# Patient Record
Sex: Female | Born: 1985 | Race: Asian | Hispanic: No | Marital: Married | Smoking: Never smoker
Health system: Southern US, Community
[De-identification: ages and names within clinical notes are randomized; demographics above are authoritative.]

## PROBLEM LIST (undated history)

## (undated) DIAGNOSIS — O1414 Severe pre-eclampsia complicating childbirth: Secondary | ICD-10-CM

## (undated) DIAGNOSIS — O24419 Gestational diabetes mellitus in pregnancy, unspecified control: Secondary | ICD-10-CM

## (undated) HISTORY — DX: Severe pre-eclampsia complicating childbirth: O14.14

## (undated) HISTORY — DX: Gestational diabetes mellitus in pregnancy, unspecified control: O24.419

---

## 2013-09-05 LAB — OB RESULTS CONSOLE RUBELLA ANTIBODY, IGM: Rubella: IMMUNE

## 2013-09-05 LAB — OB RESULTS CONSOLE HIV ANTIBODY (ROUTINE TESTING): HIV: NONREACTIVE

## 2013-09-05 LAB — OB RESULTS CONSOLE HEPATITIS B SURFACE ANTIGEN: Hepatitis B Surface Ag: NEGATIVE

## 2013-12-28 ENCOUNTER — Encounter: Payer: Medicaid Other | Attending: Obstetrics and Gynecology

## 2013-12-28 VITALS — Ht 66.0 in | Wt 171.6 lb

## 2013-12-28 DIAGNOSIS — O99810 Abnormal glucose complicating pregnancy: Secondary | ICD-10-CM | POA: Insufficient documentation

## 2013-12-28 DIAGNOSIS — Z713 Dietary counseling and surveillance: Secondary | ICD-10-CM | POA: Insufficient documentation

## 2014-01-05 NOTE — Progress Notes (Signed)
  Patient was seen on 12/28/13 for Gestational Diabetes self-management class at the [facility]. The following learning objectives were met by the patient during this course:   States the definition of Gestational Diabetes  States why dietary management is important in controlling blood glucose  Describes the effects of carbohydrates on blood glucose levels  Demonstrates ability to create a balanced meal plan  Demonstrates carbohydrate counting   States when to check blood glucose levels  Demonstrates proper blood glucose monitoring techniques  States the effect of stress and exercise on blood glucose levels  States the importance of limiting caffeine and abstaining from alcohol and smoking  Plan:  Aim for 2 Carb Choices per meal (30 grams) +/- 1 either way for breakfast Aim for 3 Carb Choices per meal (45 grams) +/- 1 either way from lunch and dinner Aim for 1-2 Carbs per snack Begin reading food labels for Total Carbohydrate and sugar grams of foods Consider  increasing your activity level by walking daily as tolerated Begin checking BG before breakfast and 1-2 hours after first bit of breakfast, lunch and dinner after  as directed by MD  Take medication  as directed by MD  Blood glucose monitor given: Accu Chek Nano BG Monitoring Kit Lot # [id number] Exp: 12-03-14 Blood glucose reading: 10m/dl  Patient instructed to monitor glucose levels: FBS: 60 - <90 1 hour: <140 2 hour: <120  Patient received the following handouts:  Nutrition Diabetes and Pregnancy  Carbohydrate Counting List  Meal Planning worksheet  Patient will be seen for follow-up as needed.

## 2014-01-29 ENCOUNTER — Encounter (HOSPITAL_COMMUNITY): Payer: Self-pay | Admitting: *Deleted

## 2014-01-29 ENCOUNTER — Inpatient Hospital Stay (HOSPITAL_COMMUNITY): Payer: Medicaid Other | Admitting: Anesthesiology

## 2014-01-29 ENCOUNTER — Inpatient Hospital Stay (HOSPITAL_COMMUNITY)
Admission: AD | Admit: 2014-01-29 | Discharge: 2014-02-01 | DRG: 765 | Disposition: A | Discharge status: Home / Self Care | Payer: Medicaid Other | Source: Ambulatory Visit | Attending: Obstetrics & Gynecology | Admitting: Obstetrics & Gynecology

## 2014-01-29 ENCOUNTER — Encounter (HOSPITAL_COMMUNITY): Payer: Medicaid Other | Admitting: Anesthesiology

## 2014-01-29 ENCOUNTER — Encounter (HOSPITAL_COMMUNITY)
Admission: AD | Disposition: A | Discharge status: Home / Self Care | Payer: Self-pay | Source: Ambulatory Visit | Attending: Obstetrics & Gynecology

## 2014-01-29 DIAGNOSIS — R197 Diarrhea, unspecified: Secondary | ICD-10-CM | POA: Diagnosis present

## 2014-01-29 DIAGNOSIS — O212 Late vomiting of pregnancy: Secondary | ICD-10-CM | POA: Diagnosis present

## 2014-01-29 DIAGNOSIS — O34219 Maternal care for unspecified type scar from previous cesarean delivery: Secondary | ICD-10-CM | POA: Diagnosis present

## 2014-01-29 DIAGNOSIS — O1414 Severe pre-eclampsia complicating childbirth: Principal | ICD-10-CM | POA: Diagnosis present

## 2014-01-29 DIAGNOSIS — O99892 Other specified diseases and conditions complicating childbirth: Secondary | ICD-10-CM

## 2014-01-29 DIAGNOSIS — O9989 Other specified diseases and conditions complicating pregnancy, childbirth and the puerperium: Secondary | ICD-10-CM

## 2014-01-29 LAB — GLUCOSE, CAPILLARY
GLUCOSE-CAPILLARY: 98 mg/dL (ref 70–99)
Glucose-Capillary: 87 mg/dL (ref 70–99)

## 2014-01-29 LAB — COMPREHENSIVE METABOLIC PANEL
ALBUMIN: 2.6 g/dL — AB (ref 3.5–5.2)
ALK PHOS: 111 U/L (ref 39–117)
ALT: 10 U/L (ref 0–35)
AST: 12 U/L (ref 0–37)
BUN: 11 mg/dL (ref 6–23)
CHLORIDE: 104 meq/L (ref 96–112)
CO2: 22 mEq/L (ref 19–32)
Calcium: 8.7 mg/dL (ref 8.4–10.5)
Creatinine, Ser: 0.54 mg/dL (ref 0.50–1.10)
GFR calc Af Amer: >90 mL/min (ref >90)
GFR calc non Af Amer: >90 mL/min (ref >90)
Glucose, Bld: 76 mg/dL (ref 70–99)
POTASSIUM: 4.4 meq/L (ref 3.7–5.3)
SODIUM: 138 meq/L (ref 137–147)
Total Protein: 6.7 g/dL (ref 6.0–8.3)

## 2014-01-29 LAB — URINALYSIS, ROUTINE W REFLEX MICROSCOPIC
Bilirubin Urine: NEGATIVE
Glucose, UA: NEGATIVE mg/dL
KETONES UR: NEGATIVE mg/dL
LEUKOCYTES UA: NEGATIVE
Nitrite: NEGATIVE
PH: 6.5 (ref 5.0–8.0)
Protein, ur: >300 mg/dL — AB
Specific Gravity, Urine: 1.025 (ref 1.005–1.030)
Urobilinogen, UA: 0.2 mg/dL (ref 0.0–1.0)

## 2014-01-29 LAB — PROTEIN / CREATININE RATIO, URINE
Creatinine, Urine: 207.05 mg/dL
Protein Creatinine Ratio: 1.89 — ABNORMAL HIGH (ref 0.00–0.15)
Total Protein, Urine: 391.2 mg/dL

## 2014-01-29 LAB — CBC
HCT: 33.1 % — ABNORMAL LOW (ref 36.0–46.0)
Hemoglobin: 10.3 g/dL — ABNORMAL LOW (ref 12.0–15.0)
MCH: 19.1 pg — ABNORMAL LOW (ref 26.0–34.0)
MCHC: 31.1 g/dL (ref 30.0–36.0)
MCV: 61.5 fL — ABNORMAL LOW (ref 78.0–100.0)
PLATELETS: 266 10*3/uL (ref 150–400)
RBC: 5.38 MIL/uL — ABNORMAL HIGH (ref 3.87–5.11)
RDW: 17.8 % — AB (ref 11.5–15.5)
WBC: 11 10*3/uL — AB (ref 4.0–10.5)

## 2014-01-29 LAB — URINE MICROSCOPIC-ADD ON

## 2014-01-29 LAB — LACTATE DEHYDROGENASE: LDH: 146 U/L (ref 94–250)

## 2014-01-29 LAB — URIC ACID: Uric Acid, Serum: 5.9 mg/dL (ref 2.4–7.0)

## 2014-01-29 SURGERY — Surgical Case
Anesthesia: Spinal | Site: Abdomen

## 2014-01-29 SURGERY — Surgical Case
Anesthesia: Regional

## 2014-01-29 MED ORDER — DIPHENHYDRAMINE HCL 50 MG/ML IJ SOLN
INTRAMUSCULAR | Status: DC | PRN
  Administered 2014-01-29: 25 mg via INTRAVENOUS

## 2014-01-29 MED ORDER — MEPERIDINE HCL 25 MG/ML IJ SOLN
INTRAMUSCULAR | Status: DC | PRN
  Administered 2014-01-29 (×2): 12.5 mg via INTRAVENOUS

## 2014-01-29 MED ORDER — KETOROLAC TROMETHAMINE 30 MG/ML IJ SOLN
INTRAMUSCULAR | Status: AC
  Administered 2014-01-30: 30 mg via INTRAVENOUS
  Filled 2014-01-29: qty 1

## 2014-01-29 MED ORDER — METOCLOPRAMIDE HCL 5 MG/ML IJ SOLN
INTRAMUSCULAR | Status: AC
  Filled 2014-01-29: qty 2

## 2014-01-29 MED ORDER — FENTANYL CITRATE 0.05 MG/ML IJ SOLN: 25.0000 ug | INTRAMUSCULAR | Status: DC | PRN

## 2014-01-29 MED ORDER — FENTANYL CITRATE 0.05 MG/ML IJ SOLN
INTRAMUSCULAR | Status: AC
  Filled 2014-01-29: qty 2

## 2014-01-29 MED ORDER — MORPHINE SULFATE 0.5 MG/ML IJ SOLN
INTRAMUSCULAR | Status: AC
  Filled 2014-01-29: qty 10

## 2014-01-29 MED ORDER — MEPERIDINE HCL 25 MG/ML IJ SOLN: 6.2500 mg | INTRAMUSCULAR | Status: DC | PRN

## 2014-01-29 MED ORDER — KETOROLAC TROMETHAMINE 30 MG/ML IJ SOLN
30.0000 mg | Freq: Four times a day (QID) | INTRAMUSCULAR | Status: AC | PRN
  Administered 2014-01-30: 30 mg via INTRAVENOUS

## 2014-01-29 MED ORDER — PHENYLEPHRINE 40 MCG/ML (10ML) SYRINGE FOR IV PUSH (FOR BLOOD PRESSURE SUPPORT)
PREFILLED_SYRINGE | INTRAVENOUS | Status: AC
  Filled 2014-01-29: qty 10

## 2014-01-29 MED ORDER — METOCLOPRAMIDE HCL 5 MG/ML IJ SOLN
INTRAMUSCULAR | Status: DC | PRN
  Administered 2014-01-29: 10 mg via INTRAVENOUS

## 2014-01-29 MED ORDER — OXYTOCIN 10 UNIT/ML IJ SOLN
INTRAMUSCULAR | Status: AC
  Filled 2014-01-29: qty 4

## 2014-01-29 MED ORDER — LACTATED RINGERS IV SOLN
40.0000 [IU] | INTRAVENOUS | Status: DC | PRN
  Administered 2014-01-29: 40 [IU] via INTRAVENOUS

## 2014-01-29 MED ORDER — PHENYLEPHRINE 8 MG IN D5W 100 ML (0.08MG/ML) PREMIX OPTIME
INJECTION | INTRAVENOUS | Status: AC
  Filled 2014-01-29: qty 100

## 2014-01-29 MED ORDER — SCOPOLAMINE 1 MG/3DAYS TD PT72
1.0000 | MEDICATED_PATCH | Freq: Once | TRANSDERMAL | Status: DC
  Administered 2014-01-29: 1.5 mg via TRANSDERMAL

## 2014-01-29 MED ORDER — SCOPOLAMINE 1 MG/3DAYS TD PT72
MEDICATED_PATCH | TRANSDERMAL | Status: AC
  Filled 2014-01-29: qty 1

## 2014-01-29 MED ORDER — PHENYLEPHRINE 8 MG IN D5W 100 ML (0.08MG/ML) PREMIX OPTIME
INJECTION | INTRAVENOUS | Status: DC | PRN
  Administered 2014-01-29: 60 ug/min via INTRAVENOUS

## 2014-01-29 MED ORDER — MAGNESIUM SULFATE BOLUS VIA INFUSION
4.0000 g | Freq: Once | INTRAVENOUS | Status: AC
  Administered 2014-01-29: 4 g via INTRAVENOUS
  Filled 2014-01-29: qty 500

## 2014-01-29 MED ORDER — FENTANYL CITRATE 0.05 MG/ML IJ SOLN
INTRAMUSCULAR | Status: DC | PRN
  Administered 2014-01-29: 25 ug via INTRAVENOUS

## 2014-01-29 MED ORDER — ONDANSETRON HCL 4 MG/2ML IJ SOLN
INTRAMUSCULAR | Status: AC
  Filled 2014-01-29: qty 2

## 2014-01-29 MED ORDER — LACTATED RINGERS IV SOLN
INTRAVENOUS | Status: DC
  Administered 2014-01-29 (×2): via INTRAVENOUS

## 2014-01-29 MED ORDER — PROMETHAZINE HCL 25 MG/ML IJ SOLN: 6.2500 mg | INTRAMUSCULAR | Status: DC | PRN

## 2014-01-29 MED ORDER — FAMOTIDINE IN NACL 20-0.9 MG/50ML-% IV SOLN
20.0000 mg | Freq: Once | INTRAVENOUS | Status: AC
  Administered 2014-01-29: 20 mg via INTRAVENOUS
  Filled 2014-01-29: qty 50

## 2014-01-29 MED ORDER — MEPERIDINE HCL 25 MG/ML IJ SOLN
INTRAMUSCULAR | Status: AC
  Filled 2014-01-29: qty 1

## 2014-01-29 MED ORDER — ONDANSETRON HCL 4 MG/2ML IJ SOLN
INTRAMUSCULAR | Status: DC | PRN
  Administered 2014-01-29: 4 mg via INTRAVENOUS

## 2014-01-29 MED ORDER — 0.9 % SODIUM CHLORIDE (POUR BTL) OPTIME
TOPICAL | Status: DC | PRN
  Administered 2014-01-29: 1000 mL

## 2014-01-29 MED ORDER — MORPHINE SULFATE (PF) 0.5 MG/ML IJ SOLN
INTRAMUSCULAR | Status: DC | PRN
  Administered 2014-01-29: .15 mg via INTRATHECAL

## 2014-01-29 MED ORDER — KETOROLAC TROMETHAMINE 30 MG/ML IJ SOLN: 30.0000 mg | Freq: Four times a day (QID) | INTRAMUSCULAR | Status: AC | PRN

## 2014-01-29 MED ORDER — FENTANYL CITRATE 0.05 MG/ML IJ SOLN
INTRAMUSCULAR | Status: DC | PRN
  Administered 2014-01-29: 25 ug via INTRATHECAL

## 2014-01-29 MED ORDER — MIDAZOLAM HCL 2 MG/2ML IJ SOLN: 0.5000 mg | Freq: Once | INTRAMUSCULAR | Status: AC | PRN

## 2014-01-29 MED ORDER — CITRIC ACID-SODIUM CITRATE 334-500 MG/5ML PO SOLN
30.0000 mL | Freq: Once | ORAL | Status: AC
  Administered 2014-01-29: 30 mL via ORAL
  Filled 2014-01-29: qty 15

## 2014-01-29 MED ORDER — DIPHENHYDRAMINE HCL 50 MG/ML IJ SOLN
INTRAMUSCULAR | Status: AC
  Filled 2014-01-29: qty 1

## 2014-01-29 MED ORDER — CEFAZOLIN SODIUM-DEXTROSE 2-3 GM-% IV SOLR
2.0000 g | INTRAVENOUS | Status: AC
  Administered 2014-01-29: 2 g via INTRAVENOUS
  Filled 2014-01-29: qty 50

## 2014-01-29 MED ORDER — BUPIVACAINE IN DEXTROSE 0.75-8.25 % IT SOLN
INTRATHECAL | Status: DC | PRN
  Administered 2014-01-29: 1.6 mL via INTRATHECAL

## 2014-01-29 MED ORDER — MAGNESIUM SULFATE 40 G IN LACTATED RINGERS - SIMPLE
2.0000 g/h | INTRAVENOUS | Status: DC
  Administered 2014-01-29: 2 g/h via INTRAVENOUS
  Filled 2014-01-29: qty 500

## 2014-01-29 SURGICAL SUPPLY — 35 items
BENZOIN TINCTURE PRP APPL 2/3 (GAUZE/BANDAGES/DRESSINGS) IMPLANT
CLAMP CORD UMBIL (MISCELLANEOUS) IMPLANT
CLOTH BEACON ORANGE TIMEOUT ST (SAFETY) IMPLANT
DRAPE LG THREE QUARTER DISP (DRAPES) IMPLANT
DRSG OPSITE POSTOP 4X10 (GAUZE/BANDAGES/DRESSINGS) IMPLANT
DURAPREP 26ML APPLICATOR (WOUND CARE) IMPLANT
ELECT REM PT RETURN 9FT ADLT (ELECTROSURGICAL)
ELECTRODE REM PT RTRN 9FT ADLT (ELECTROSURGICAL) IMPLANT
EXTRACTOR VACUUM BELL STYLE (SUCTIONS) IMPLANT
GLOVE BIO SURGEON STRL SZ 6.5 (GLOVE) IMPLANT
GLOVE BIOGEL PI IND STRL 7.0 (GLOVE) IMPLANT
GLOVE BIOGEL PI INDICATOR 7.0 (GLOVE)
GOWN STRL REUS W/TWL LRG LVL3 (GOWN DISPOSABLE) IMPLANT
KIT ABG SYR 3ML LUER SLIP (SYRINGE) IMPLANT
NEEDLE HYPO 25X5/8 SAFETYGLIDE (NEEDLE) IMPLANT
NS IRRIG 1000ML POUR BTL (IV SOLUTION) IMPLANT
PACK C SECTION WH (CUSTOM PROCEDURE TRAY) IMPLANT
PAD OB MATERNITY 4.3X12.25 (PERSONAL CARE ITEMS) IMPLANT
RETRACTOR WND ALEXIS 25 LRG (MISCELLANEOUS) IMPLANT
RTRCTR WOUND ALEXIS 25CM LRG (MISCELLANEOUS)
STAPLER VISISTAT 35W (STAPLE) IMPLANT
STRIP CLOSURE SKIN 1/2X4 (GAUZE/BANDAGES/DRESSINGS) IMPLANT
SUT MNCRL 0 VIOLET CTX 36 (SUTURE) IMPLANT
SUT MONOCRYL 0 CTX 36 (SUTURE)
SUT PDS AB 0 CTX 36 PDP370T (SUTURE) IMPLANT
SUT PLAIN 2 0 (SUTURE)
SUT PLAIN ABS 2-0 CT1 27XMFL (SUTURE) IMPLANT
SUT VIC AB 0 CT1 27 (SUTURE)
SUT VIC AB 0 CT1 27XBRD ANBCTR (SUTURE) IMPLANT
SUT VIC AB 2-0 CT1 27 (SUTURE)
SUT VIC AB 2-0 CT1 TAPERPNT 27 (SUTURE) IMPLANT
SUT VIC AB 4-0 KS 27 (SUTURE) IMPLANT
TOWEL OR 17X24 6PK STRL BLUE (TOWEL DISPOSABLE) IMPLANT
TRAY FOLEY CATH 14FR (SET/KITS/TRAYS/PACK) IMPLANT
WATER STERILE IRR 1000ML POUR (IV SOLUTION) IMPLANT

## 2014-01-29 SURGICAL SUPPLY — 37 items
BENZOIN TINCTURE PRP APPL 2/3 (GAUZE/BANDAGES/DRESSINGS) ×2 IMPLANT
CLAMP CORD UMBIL (MISCELLANEOUS) ×2 IMPLANT
CLOTH BEACON ORANGE TIMEOUT ST (SAFETY) ×2 IMPLANT
DRAPE LG THREE QUARTER DISP (DRAPES) ×2 IMPLANT
DRSG OPSITE POSTOP 4X10 (GAUZE/BANDAGES/DRESSINGS) ×2 IMPLANT
DURAPREP 26ML APPLICATOR (WOUND CARE) ×2 IMPLANT
ELECT REM PT RETURN 9FT ADLT (ELECTROSURGICAL) ×2
ELECTRODE REM PT RTRN 9FT ADLT (ELECTROSURGICAL) ×1 IMPLANT
EXTRACTOR VACUUM BELL STYLE (SUCTIONS) ×2 IMPLANT
GLOVE BIO SURGEON STRL SZ 6.5 (GLOVE) ×2 IMPLANT
GLOVE BIOGEL PI IND STRL 7.0 (GLOVE) ×1 IMPLANT
GLOVE BIOGEL PI INDICATOR 7.0 (GLOVE) ×1
GOWN STRL REUS W/TWL LRG LVL3 (GOWN DISPOSABLE) ×6 IMPLANT
KIT ABG SYR 3ML LUER SLIP (SYRINGE) ×2 IMPLANT
NEEDLE HYPO 25X5/8 SAFETYGLIDE (NEEDLE) ×2 IMPLANT
NS IRRIG 1000ML POUR BTL (IV SOLUTION) ×2 IMPLANT
PACK C SECTION WH (CUSTOM PROCEDURE TRAY) ×2 IMPLANT
PAD OB MATERNITY 4.3X12.25 (PERSONAL CARE ITEMS) ×2 IMPLANT
RETRACTOR WND ALEXIS 25 LRG (MISCELLANEOUS) ×1 IMPLANT
RTRCTR WOUND ALEXIS 25CM LRG (MISCELLANEOUS) ×2
SLEEVE SCD COMPRESS KNEE MED (MISCELLANEOUS) ×2 IMPLANT
STAPLER VISISTAT 35W (STAPLE) IMPLANT
STRIP CLOSURE SKIN 1/2X4 (GAUZE/BANDAGES/DRESSINGS) ×2 IMPLANT
SUT CHROMIC 2 0 CT 1 (SUTURE) ×4 IMPLANT
SUT MNCRL 0 VIOLET CTX 36 (SUTURE) ×2 IMPLANT
SUT MONOCRYL 0 CTX 36 (SUTURE) ×2
SUT PDS AB 0 CTX 36 PDP370T (SUTURE) ×2 IMPLANT
SUT PLAIN 2 0 (SUTURE) ×1
SUT PLAIN ABS 2-0 CT1 27XMFL (SUTURE) ×1 IMPLANT
SUT VIC AB 0 CT1 27 (SUTURE)
SUT VIC AB 0 CT1 27XBRD ANBCTR (SUTURE) IMPLANT
SUT VIC AB 2-0 CT1 27 (SUTURE)
SUT VIC AB 2-0 CT1 TAPERPNT 27 (SUTURE) IMPLANT
SUT VIC AB 4-0 KS 27 (SUTURE) ×2 IMPLANT
TOWEL OR 17X24 6PK STRL BLUE (TOWEL DISPOSABLE) ×2 IMPLANT
TRAY FOLEY CATH 14FR (SET/KITS/TRAYS/PACK) ×2 IMPLANT
WATER STERILE IRR 1000ML POUR (IV SOLUTION) IMPLANT

## 2014-01-29 NOTE — MAU Provider Note (Signed)
I agree with the above note  Adrieanna Boteler STACIA

## 2014-01-29 NOTE — Brief Op Note (Signed)
01/29/2014  9:56 PM  PATIENT:  Veronica Cannon  28 y.o. female  PRE-OPERATIVE DIAGNOSIS:  Previous Cesarean Section, Pre Eclampsia  POST-OPERATIVE DIAGNOSIS:  Previous Cesarean Section, Pre Eclampsia  PROCEDURE:  Procedure(s): Repeat Cesarean Section Delivery Baby Girl  @ 2112,  Apgars 8/9 (N/A)  SURGEON:  Surgeon(s) and Role:    * Essie HartWalda Alandria Butkiewicz, MD - Primary  PHYSICIAN ASSISTANT:   ASSISTANTS: none   ANESTHESIA:   spinal  EBL:  Total I/O In: -  Out: 750 [Urine:150; Blood:600]  BLOOD ADMINISTERED:none  DRAINS: Urinary Catheter (Foley)   LOCAL MEDICATIONS USED:  NONE  SPECIMEN:  Source of Specimen:  Placenta  DISPOSITION OF SPECIMEN:  PATHOLOGY  COUNTS:  YES  TOURNIQUET:  * No tourniquets in log *  DICTATION: .Note written in EPIC  PLAN OF CARE: Admit to inpatient   PATIENT DISPOSITION:  PACU - hemodynamically stable.   Delay start of Pharmacological VTE agent (>24hrs) due to surgical blood loss or risk of bleeding: not applicable  Sharen Youngren STACIA

## 2014-01-29 NOTE — Op Note (Signed)
Cesarean Section Procedure Note  Indications: previous uterine incision kerr x1 and patient with new diagnosis of Severe Pre-eclampsia at 36 weeks  Pre-operative Diagnosis: 36 week 0 day pregnancy, prior LTCS with Severe pre-eclampsia  Post-operative Diagnosis: same  Surgeon: Wynonia Hazard   Assistants: None  Anesthesia: Spinal anesthesia  ASA Class: 2  Procedure Details   The patient was seen in the Holding Room. The risks, benefits, complications, treatment options, and expected outcomes were discussed with the patient.  The patient concurred with the proposed plan, giving informed consent.  The site of surgery properly noted/marked. The patient was taken to Operating Room # 9, identified as Veronica Cannon and the procedure verified as C-Section Delivery. A Time Out was held and the above information confirmed.  After induction of anesthesia, the patient was placed in the dorsal supine position with a leftward tilt, draped and prepped in the usual sterile manner. A Pfannenstiel incision was made and carried down through the subcutaneous tissue to the fascia.  The fascia was incised in the midline and the fascial incision was extended laterally with Mayo scissors. The superior aspect of the fascial incision was grasped with Coker clamps x2, tented up and the rectus muscles dissected off sharply with the scalpel. The rectus was then dissected off with blunt dissection and Mayo scissors inferiorly. The rectus muscles were separated in the midline. The abdominal peritoneum was identified, tented up, entered sharply using Metzenbaum scissors, and the incision was extended superiorly and inferiorly with good visualization of the bladder. The Alexis retractor was then deployed. Scalpel was then used to make a low transverse incision on the uterus which was extended laterally with  blunt dissection. The fetal vertex was identified, delivered easily through the uterine incision followed by the  body. The A live female infant was bulb suctioned on the operative field cried vigorously, cord was clamped and cut and the infant was passed to the waiting neonatologist. Apgars 8/9. Placenta was then delivered spontaneously, intact and appear normal, the uterus was cleared of all clot and debris. The uterine incision was repaired with #0 Monocryl in running locked fashion. One layer was performed as the incision was hemostatic. Ovaries and tubes were inspected and normal. The Alexis retractor was removed. The abdominal cavity was cleared of all clot and debris with irrigation. The abdominal peritoneum was reapproximated with 2-0 Chromic n a running fashion, the rectus muscles was reapproximated with #2 Chromic in interrupted fashion. The fascia was closed with 0 non-looped PDS in a running fashion. The subcuticular layer was re-approximated with 2-0 plain gut. The skin was closed with 4-0 vicryl in a subcuticular fashion and dressed with benzoine, steri strips and pressure dressing. All sponge lap and needle counts were correct x3. Patient tolerated the procedure well and recovered in stable condition following the procedure.  Instrument, sponge, and needle counts were correct prior the abdominal closure and at the conclusion of the case.   Findings: Live female infant, Apgars 9/9, thin meconium, normal appearing placenta, normal uterus, bilateral tubes and ovaries  Estimated Blood Loss:          Drains: Foley catheter                 Specimens: Placenta          Implants: none         Complications:  None; patient tolerated the procedure well.         Disposition: PACU - hemodynamically stable.  Esgar Barnick, Naoma Diener  STACIA

## 2014-01-29 NOTE — MAU Note (Signed)
Pt presents with complaints of nausea, vomiting, diarrhea, back pain, and contractions since last night. Last diarrhea stool was at 2 pm today.

## 2014-01-29 NOTE — Anesthesia Procedure Notes (Signed)
Spinal  Patient location during procedure: OR Start time: 01/29/2014 8:46 PM Staffing Anesthesiologist: Brayton CavesJACKSON, Shondell Fabel Performed by: anesthesiologist  Preanesthetic Checklist Completed: patient identified, site marked, surgical consent, pre-op evaluation, timeout performed, IV checked, risks and benefits discussed and monitors and equipment checked Spinal Block Patient position: sitting Prep: DuraPrep Patient monitoring: heart rate, cardiac monitor, continuous pulse ox and blood pressure Approach: midline Location: L3-4 Injection technique: single-shot Needle Needle type: Sprotte  Needle gauge: 24 G Needle length: 9 cm Assessment Sensory level: T4 Additional Notes Patient identified.  Risk benefits discussed including failed block, incomplete pain control, headache, nerve damage, paralysis, blood pressure changes, nausea, vomiting, reactions to medication both toxic or allergic, and postpartum back pain.  Patient expressed understanding and wished to proceed.  All questions were answered.  Sterile technique used throughout procedure.  CSF was clear.  No parasthesia or other complications.  Please see nursing notes for vital signs.

## 2014-01-29 NOTE — Progress Notes (Signed)
Patient revealed that she drank ginger ale 40 minutes ago. However, decision made to proceed now with repeat cesarean delivery due to her neurologic symptoms and risk of eclamptic seizure Discussed with anesthesia.   Veronica Cannon STACIA

## 2014-01-29 NOTE — MAU Provider Note (Signed)
History     CSN: [id number]  Arrival date and time: 01/29/14 1631   First Provider Initiated Contact with Patient 01/29/14 1703      Chief Complaint  Patient presents with  . Back Pain  . Nausea  . Emesis  . Diarrhea  . Contractions   HPI Ms. Azure Budnick is a 28 y.o. G1P0 at [redacted]w[redacted]d who presents to MAU today with complaint of N/V/D since last night. The patient states 1 episode of emesis and 2 episodes of loose stools today. She is also having RLQ pain with contractions. She notes contractions q 15-20 minutes today. She denies vaginal bleeding, discharge or LOF. She denies a history of HTN or elevated BPs. She denies headache, blurred vision, RUQ pain or significant peripheral edema. She has had floaters. She reports good fetal movement.   OB History   Grav Para Term Preterm Abortions TAB SAB Ect Mult Living   2 1 1       1       Past Medical History  Diagnosis Date  . Gestational diabetes mellitus, antepartum     Past Surgical History  Procedure Laterality Date  . Cesarean section      Family History  Problem Relation Age of Onset  . Cancer Other   . Diabetes Other   . Hypertension Other   . Heart disease Other     History  Substance Use Topics  . Smoking status: Never Smoker   . Smokeless tobacco: Not on file  . Alcohol Use: No    Allergies: No Known Allergies  Prescriptions prior to admission  Medication Sig Dispense Refill  . Prenatal Vit-Fe Fumarate-FA (PRENATAL MULTIVITAMIN) TABS tablet Take 1 tablet by mouth daily at 12 noon.        Review of Systems  Constitutional: Negative for fever and malaise/fatigue.  Gastrointestinal: Positive for nausea, vomiting, abdominal pain and diarrhea. Negative for constipation.  Genitourinary: Negative for dysuria, urgency and frequency.       Neg - vaginal bleeding, discharge, LOF   Physical Exam   Blood pressure 131/94, pulse 94, temperature 98.2 F (36.8 C), resp. rate 18, height 5\' 5"  (1.651 m), weight  179 lb (81.194 kg), last menstrual period 05/22/2013.  Physical Exam  Constitutional: She is oriented to person, place, and time. She appears well-developed and well-nourished. No distress.  HENT:  Head: Normocephalic and atraumatic.  Cardiovascular: Normal rate.   Respiratory: Effort normal.  GI: Soft. She exhibits no distension and no mass. There is no tenderness. There is no rebound and no guarding.  Musculoskeletal: She exhibits no edema.  Neurological: She is alert and oriented to person, place, and time. She has normal reflexes.  No Clonus  Skin: Skin is warm and dry. No erythema.  Psychiatric: She has a normal mood and affect.   Results for orders placed during the hospital encounter of 01/29/14 (from the past 24 hour(s))  URINALYSIS, ROUTINE W REFLEX MICROSCOPIC     Status: Abnormal   Collection Time    01/29/14  4:55 PM      Result Value Ref Range   Color, Urine YELLOW  YELLOW   APPearance CLEAR  CLEAR   Specific Gravity, Urine 1.025  1.005 - 1.030   pH 6.5  5.0 - 8.0   Glucose, UA NEGATIVE  NEGATIVE mg/dL   Hgb urine dipstick TRACE (*) NEGATIVE   Bilirubin Urine NEGATIVE  NEGATIVE   Ketones, ur NEGATIVE  NEGATIVE mg/dL   Protein, ur >409 (*) NEGATIVE mg/dL  Urobilinogen, UA 0.2  0.0 - 1.0 mg/dL   Nitrite NEGATIVE  NEGATIVE   Leukocytes, UA NEGATIVE  NEGATIVE  URINE MICROSCOPIC-ADD ON     Status: Abnormal   Collection Time    01/29/14  4:55 PM      Result Value Ref Range   Squamous Epithelial / LPF FEW (*) RARE   WBC, UA 0-2  <3 WBC/hpf   RBC / HPF 0-2  <3 RBC/hpf   Urine-Other MUCOUS PRESENT    PROTEIN / CREATININE RATIO, URINE     Status: Abnormal   Collection Time    01/29/14  4:55 PM      Result Value Ref Range   Creatinine, Urine 207.05     Total Protein, Urine 391.2     PROTEIN CREATININE RATIO 1.89 (*) 0.00 - 0.15  CBC     Status: Abnormal   Collection Time    01/29/14  6:00 PM      Result Value Ref Range   WBC 11.0 (*) 4.0 - 10.5 K/uL   RBC 5.38  (*) 3.87 - 5.11 MIL/uL   Hemoglobin 10.3 (*) 12.0 - 15.0 g/dL   HCT 16.1 (*) 09.6 - 04.5 %   MCV 61.5 (*) 78.0 - 100.0 fL   MCH 19.1 (*) 26.0 - 34.0 pg   MCHC 31.1  30.0 - 36.0 g/dL   RDW 40.9 (*) 81.1 - 91.4 %   Platelets 266  150 - 400 K/uL  COMPREHENSIVE METABOLIC PANEL     Status: Abnormal   Collection Time    01/29/14  6:00 PM      Result Value Ref Range   Sodium 138  137 - 147 mEq/L   Potassium 4.4  3.7 - 5.3 mEq/L   Chloride 104  96 - 112 mEq/L   CO2 22  19 - 32 mEq/L   Glucose, Bld 76  70 - 99 mg/dL   BUN 11  6 - 23 mg/dL   Creatinine, Ser 7.82  0.50 - 1.10 mg/dL   Calcium 8.7  8.4 - 95.6 mg/dL   Total Protein 6.7  6.0 - 8.3 g/dL   Albumin 2.6 (*) 3.5 - 5.2 g/dL   AST 12  0 - 37 U/L   ALT 10  0 - 35 U/L   Alkaline Phosphatase 111  39 - 117 U/L   Total Bilirubin <0.2 (*) 0.3 - 1.2 mg/dL   GFR calc non Af Amer >90  >90 mL/min   GFR calc Af Amer >90  >90 mL/min  URIC ACID     Status: None   Collection Time    01/29/14  6:00 PM      Result Value Ref Range   Uric Acid, Serum 5.9  2.4 - 7.0 mg/dL  LACTATE DEHYDROGENASE     Status: None   Collection Time    01/29/14  6:00 PM      Result Value Ref Range   LDH 146  94 - 250 U/L   Patient Vitals for the past 24 hrs:  BP Temp Pulse Resp Height Weight  01/29/14 1903 131/94 mmHg - - - - -  01/29/14 1821 137/84 mmHg - - - - -  01/29/14 1746 128/94 mmHg - - - - -  01/29/14 1717 143/100 mmHg - 94 - - -  01/29/14 1713 153/104 mmHg - - - - -  01/29/14 1651 144/87 mmHg 98.2 F (36.8 C) 89 18 5\' 5"  (1.651 m) 179 lb (81.194 kg)    Fetal  Monitoring: Baseline: 135 bpm, moderate variability, + accelerations, no decelerations Contractions: moderate UI  MAU Course  Procedures None  MDM UA today Discussed with Dr. Mora ApplPinn. Order CBC, CMP, Uric Acid, LDH and protein/creatinine ratio today. Continue serial BPs.  Discussed results with Dr. Mora ApplPinn. Will go to OR for repeat C/S today Assessment and Plan  A: Pre-Eclampsia H/O  previous C/S  P: Patient to OR for repeat C/S with Dr. Mervin KungPinn  Julie N Ethier, PA-C  01/29/2014, 7:40 PM

## 2014-01-29 NOTE — Anesthesia Postprocedure Evaluation (Signed)
  Anesthesia Post Note  Patient: Veronica Cannon  Procedure(s) Performed: Procedure(s) (LRB): Repeat Cesarean Section Delivery Baby Girl  @ 2112,  Apgars 8/9 (N/A)  Anesthesia type: Spinal  Patient location: PACU  Post pain: Pain level controlled  Post assessment: Post-op Vital signs reviewed  Last Vitals:  Filed Vitals:   01/29/14 2203  BP: 126/66  Pulse: 89  Temp: 36.7 C  Resp: 24    Post vital signs: Reviewed  Level of consciousness: awake  Complications: No apparent anesthesia complications

## 2014-01-29 NOTE — Transfer of Care (Signed)
Immediate Anesthesia Transfer of Care Note  Patient: Veronica Cannon  Procedure(s) Performed: Procedure(s): Repeat Cesarean Section Delivery Baby Girl  @ 2112,  Apgars 8/9 (N/A)  Patient Location: PACU  Anesthesia Type:Spinal  Level of Consciousness: awake, alert  and oriented  Airway & Oxygen Therapy: Patient Spontanous Breathing  Post-op Assessment: Report given to PACU RN and Post -op Vital signs reviewed and stable  Post vital signs: Reviewed and stable  Complications: No apparent anesthesia complications

## 2014-01-29 NOTE — Anesthesia Preprocedure Evaluation (Signed)
Anesthesia Evaluation  Patient identified by MRN, date of birth, ID band Patient awake    Reviewed: Allergy & Precautions, H&P , NPO status , Patient's Chart, lab work & pertinent test results  Airway Mallampati: II      Dental   Pulmonary  breath sounds clear to auscultation        Cardiovascular Exercise Tolerance: Good hypertension, Rhythm:regular Rate:Normal     Neuro/Psych    GI/Hepatic   Endo/Other  diabetes  Renal/GU      Musculoskeletal   Abdominal   Peds  Hematology   Anesthesia Other Findings Blurred vision  Reproductive/Obstetrics (+) Pregnancy                           Anesthesia Physical Anesthesia Plan  ASA: III and emergent  Anesthesia Plan: Spinal   Post-op Pain Management:    Induction:   Airway Management Planned:   Additional Equipment:   Intra-op Plan:   Post-operative Plan:   Informed Consent: I have reviewed the patients History and Physical, chart, labs and discussed the procedure including the risks, benefits and alternatives for the proposed anesthesia with the patient or authorized representative who has indicated his/her understanding and acceptance.     Plan Discussed with: Anesthesiologist, CRNA and Surgeon  Anesthesia Plan Comments:         Anesthesia Quick Evaluation

## 2014-01-29 NOTE — H&P (Signed)
Chief Complaint   Patient presents with   .  Back Pain   .  Nausea   .  Emesis   .  Diarrhea   .  Contractions   HPI  Ms. Veronica Cannon is a 28 y.o. G2P1 at [redacted]w[redacted]d who presented to MAU today with complaint of N/V/D since last night. The patient states 1 episode of emesis and 2 episodes of loose stools today. She is also having RLQ pain with contractions. She notes contractions q 15-20 minutes today. She denies vaginal bleeding, discharge or LOF. She notes proteinuria with her last pregnancy unsure about Blood pressure issues.  She denies headache, she does note scotomata. She denies RUQ pain. She denies vaginal bleeding or leaking of fluid and notes good fetal movements. She is a prior Cesarean delivery who desires elective repeat.   OB History    Grav  Para  Term  Preterm  Abortions  TAB  SAB  Ect  Mult  Living    2  1  1        1       Past Medical History   Diagnosis  Date   .  Gestational diabetes mellitus, antepartum     Past Surgical History   Procedure  Laterality  Date   .  Cesarean section      Family History   Problem  Relation  Age of Onset   .  Cancer  Other    .  Diabetes  Other    .  Hypertension  Other    .  Heart disease  Other     History   Substance Use Topics   .  Smoking status:  Never Smoker   .  Smokeless tobacco:  Not on file   .  Alcohol Use:  No   Allergies: No Known Allergies  Prescriptions prior to admission   Medication  Sig  Dispense  Refill   .  Prenatal Vit-Fe Fumarate-FA (PRENATAL MULTIVITAMIN) TABS tablet  Take 1 tablet by mouth daily at 12 noon.     Review of Systems  Constitutional: Negative for fever and malaise/fatigue.  Gastrointestinal: Positive for nausea, vomiting, abdominal pain and diarrhea. Negative for constipation.  Genitourinary: Negative for dysuria, urgency and frequency.  Neg - vaginal bleeding, discharge, LOF  Physical Exam   Blood pressure 131/94, pulse 94, temperature 98.2 F (36.8 C), resp. rate 18, height 5\' 5"   (1.651 m), weight 179 lb (81.194 kg), last menstrual period 05/22/2013.  Physical Exam  Constitutional: She is oriented to person, place, and time. She appears well-developed and well-nourished. No distress.  HENT:  Head: Normocephalic and atraumatic.  Cardiovascular: Normal rate.  Respiratory: Effort normal.  GI: Soft. She exhibits no distension and no mass. There is no tenderness. There is no rebound and no guarding.  Musculoskeletal: She exhibits no edema.  Neurological: She is alert and oriented to person, place, and time. She has normal reflexes.  No Clonus  Skin: Skin is warm and dry. No erythema.  Psychiatric: She has a normal mood and affect.  Results for orders placed during the hospital encounter of 01/29/14 (from the past 24 hour(s))   URINALYSIS, ROUTINE W REFLEX MICROSCOPIC Status: Abnormal    Collection Time    01/29/14 4:55 PM   Result  Value  Ref Range    Color, Urine  YELLOW  YELLOW    APPearance  CLEAR  CLEAR    Specific Gravity, Urine  1.025  1.005 - 1.030  pH  6.5  5.0 - 8.0    Glucose, UA  NEGATIVE  NEGATIVE mg/dL    Hgb urine dipstick  TRACE (*)  NEGATIVE    Bilirubin Urine  NEGATIVE  NEGATIVE    Ketones, ur  NEGATIVE  NEGATIVE mg/dL    Protein, ur  >951>300 (*)  NEGATIVE mg/dL    Urobilinogen, UA  0.2  0.0 - 1.0 mg/dL    Nitrite  NEGATIVE  NEGATIVE    Leukocytes, UA  NEGATIVE  NEGATIVE   URINE MICROSCOPIC-ADD ON Status: Abnormal    Collection Time    01/29/14 4:55 PM   Result  Value  Ref Range    Squamous Epithelial / LPF  FEW (*)  RARE    WBC, UA  0-2  <3 WBC/hpf    RBC / HPF  0-2  <3 RBC/hpf    Urine-Other  MUCOUS PRESENT    PROTEIN / CREATININE RATIO, URINE Status: Abnormal    Collection Time    01/29/14 4:55 PM   Result  Value  Ref Range    Creatinine, Urine  207.05     Total Protein, Urine  391.2     PROTEIN CREATININE RATIO  1.89 (*)  0.00 - 0.15   CBC Status: Abnormal    Collection Time    01/29/14 6:00 PM   Result  Value  Ref Range    WBC   11.0 (*)  4.0 - 10.5 K/uL    RBC  5.38 (*)  3.87 - 5.11 MIL/uL    Hemoglobin  10.3 (*)  12.0 - 15.0 g/dL    HCT  88.433.1 (*)  16.636.0 - 46.0 %    MCV  61.5 (*)  78.0 - 100.0 fL    MCH  19.1 (*)  26.0 - 34.0 pg    MCHC  31.1  30.0 - 36.0 g/dL    RDW  06.317.8 (*)  01.611.5 - 15.5 %    Platelets  266  150 - 400 K/uL   COMPREHENSIVE METABOLIC PANEL Status: Abnormal    Collection Time    01/29/14 6:00 PM   Result  Value  Ref Range    Sodium  138  137 - 147 mEq/L    Potassium  4.4  3.7 - 5.3 mEq/L    Chloride  104  96 - 112 mEq/L    CO2  22  19 - 32 mEq/L    Glucose, Bld  76  70 - 99 mg/dL    BUN  11  6 - 23 mg/dL    Creatinine, Ser  0.100.54  0.50 - 1.10 mg/dL    Calcium  8.7  8.4 - 10.5 mg/dL    Total Protein  6.7  6.0 - 8.3 g/dL    Albumin  2.6 (*)  3.5 - 5.2 g/dL    AST  12  0 - 37 U/L    ALT  10  0 - 35 U/L    Alkaline Phosphatase  111  39 - 117 U/L    Total Bilirubin  <0.2 (*)  0.3 - 1.2 mg/dL    GFR calc non Af Amer  >90  >90 mL/min    GFR calc Af Amer  >90  >90 mL/min   URIC ACID Status: None    Collection Time    01/29/14 6:00 PM   Result  Value  Ref Range    Uric Acid, Serum  5.9  2.4 - 7.0 mg/dL   LACTATE DEHYDROGENASE Status: None  Collection Time    01/29/14 6:00 PM   Result  Value  Ref Range    LDH  146  94 - 250 U/L   Patient Vitals for the past 24 hrs:   BP  Temp  Pulse  Resp  Height  Weight   01/29/14 1903  131/94 mmHg  -  -  -  -  -   01/29/14 1821  137/84 mmHg  -  -  -  -  -   01/29/14 1746  128/94 mmHg  -  -  -  -  -   01/29/14 1717  143/100 mmHg  -  94  -  -  -   01/29/14 1713  153/104 mmHg  -  -  -  -  -   01/29/14 1651  144/87 mmHg  98.2 F (36.8 C)  89  18  5\' 5"  (1.651 m)  179 lb (81.194 kg)   Fetal Monitoring:  Baseline: 135 bpm, moderate variability, + accelerations, no decelerations  Contractions: moderate UI   Assessment and Plan   A:  Severe Pre-Eclampsia as patient is having neurologic symptoms, Abnormal Protein / Cr ratio and proteinuria with elevated  BPs H/O previous C/S   P:  Patient to OR for repeat C/S  Discussed Risks of preterm Delivery with patient will have Neonatal team ready MFM paged to discuss case Magnesium sulfate for seizure prophylaxis post operatively for 24 hours.   Veronica Cannon STACIA

## 2014-01-30 ENCOUNTER — Encounter (HOSPITAL_COMMUNITY): Payer: Self-pay | Admitting: Certified Registered"

## 2014-01-30 ENCOUNTER — Encounter (HOSPITAL_COMMUNITY): Payer: Self-pay | Admitting: Obstetrics and Gynecology

## 2014-01-30 DIAGNOSIS — O1414 Severe pre-eclampsia complicating childbirth: Secondary | ICD-10-CM | POA: Diagnosis present

## 2014-01-30 DIAGNOSIS — O9989 Other specified diseases and conditions complicating pregnancy, childbirth and the puerperium: Secondary | ICD-10-CM

## 2014-01-30 DIAGNOSIS — O99892 Other specified diseases and conditions complicating childbirth: Secondary | ICD-10-CM

## 2014-01-30 HISTORY — DX: Severe pre-eclampsia complicating childbirth: O14.14

## 2014-01-30 LAB — CBC
HCT: 30.2 % — ABNORMAL LOW (ref 36.0–46.0)
HEMOGLOBIN: 9.1 g/dL — AB (ref 12.0–15.0)
MCH: 18.5 pg — AB (ref 26.0–34.0)
MCHC: 30.1 g/dL (ref 30.0–36.0)
MCV: 61.5 fL — ABNORMAL LOW (ref 78.0–100.0)
Platelets: 239 10*3/uL (ref 150–400)
RBC: 4.91 MIL/uL (ref 3.87–5.11)
RDW: 17.7 % — ABNORMAL HIGH (ref 11.5–15.5)
WBC: 13 10*3/uL — ABNORMAL HIGH (ref 4.0–10.5)

## 2014-01-30 LAB — RPR: RPR Ser Ql: NONREACTIVE

## 2014-01-30 MED ORDER — MAGNESIUM SULFATE 40 G IN LACTATED RINGERS - SIMPLE: 2.0000 g/h | INTRAVENOUS | Status: AC

## 2014-01-30 MED ORDER — IBUPROFEN 600 MG PO TABS
600.0000 mg | ORAL_TABLET | Freq: Four times a day (QID) | ORAL | Status: DC
  Administered 2014-01-30 – 2014-02-01 (×9): 600 mg via ORAL
  Filled 2014-01-30 (×9): qty 1

## 2014-01-30 MED ORDER — LANOLIN HYDROUS EX OINT: 1.0000 "application " | TOPICAL_OINTMENT | CUTANEOUS | Status: DC | PRN

## 2014-01-30 MED ORDER — SODIUM CHLORIDE 0.9 % IJ SOLN: 3.0000 mL | INTRAMUSCULAR | Status: DC | PRN

## 2014-01-30 MED ORDER — ACETAMINOPHEN 500 MG PO TABS
1000.0000 mg | ORAL_TABLET | Freq: Four times a day (QID) | ORAL | Status: AC
  Administered 2014-01-30 (×4): 1000 mg via ORAL
  Filled 2014-01-30 (×4): qty 2

## 2014-01-30 MED ORDER — TETANUS-DIPHTH-ACELL PERTUSSIS 5-2.5-18.5 LF-MCG/0.5 IM SUSP
0.5000 mL | Freq: Once | INTRAMUSCULAR | Status: AC
  Administered 2014-01-30: 0.5 mL via INTRAMUSCULAR
  Filled 2014-01-30: qty 0.5

## 2014-01-30 MED ORDER — SODIUM CHLORIDE 0.9 % IJ SOLN
3.0000 mL | INTRAMUSCULAR | Status: DC | PRN
  Administered 2014-01-30: 3 mL via INTRAVENOUS

## 2014-01-30 MED ORDER — ONDANSETRON HCL 4 MG PO TABS: 4.0000 mg | ORAL_TABLET | ORAL | Status: DC | PRN

## 2014-01-30 MED ORDER — NALOXONE HCL 0.4 MG/ML IJ SOLN: 0.4000 mg | INTRAMUSCULAR | Status: DC | PRN

## 2014-01-30 MED ORDER — NALBUPHINE HCL 10 MG/ML IJ SOLN: 5.0000 mg | INTRAMUSCULAR | Status: DC | PRN

## 2014-01-30 MED ORDER — MAGNESIUM SULFATE 40 G IN LACTATED RINGERS - SIMPLE: 2.0000 g/h | INTRAVENOUS | Status: DC

## 2014-01-30 MED ORDER — SIMETHICONE 80 MG PO CHEW
80.0000 mg | CHEWABLE_TABLET | ORAL | Status: DC | PRN
  Administered 2014-01-30: 80 mg via ORAL

## 2014-01-30 MED ORDER — MENTHOL 3 MG MT LOZG: 1.0000 | LOZENGE | OROMUCOSAL | Status: DC | PRN

## 2014-01-30 MED ORDER — ZOLPIDEM TARTRATE 5 MG PO TABS: 5.0000 mg | ORAL_TABLET | Freq: Every evening | ORAL | Status: DC | PRN

## 2014-01-30 MED ORDER — IBUPROFEN 600 MG PO TABS: 600.0000 mg | ORAL_TABLET | Freq: Four times a day (QID) | ORAL | Status: DC | PRN

## 2014-01-30 MED ORDER — DIBUCAINE 1 % RE OINT: 1.0000 "application " | TOPICAL_OINTMENT | RECTAL | Status: DC | PRN

## 2014-01-30 MED ORDER — WITCH HAZEL-GLYCERIN EX PADS: 1.0000 "application " | MEDICATED_PAD | CUTANEOUS | Status: DC | PRN

## 2014-01-30 MED ORDER — DIPHENHYDRAMINE HCL 25 MG PO CAPS
25.0000 mg | ORAL_CAPSULE | Freq: Four times a day (QID) | ORAL | Status: DC | PRN
Start: 2014-01-30 — End: 2014-02-01

## 2014-01-30 MED ORDER — SIMETHICONE 80 MG PO CHEW
80.0000 mg | CHEWABLE_TABLET | ORAL | Status: DC
  Administered 2014-01-31: 80 mg via ORAL
  Filled 2014-01-30 (×2): qty 1

## 2014-01-30 MED ORDER — DIPHENHYDRAMINE HCL 50 MG/ML IJ SOLN: 12.5000 mg | INTRAMUSCULAR | Status: DC | PRN

## 2014-01-30 MED ORDER — NALOXONE HCL 1 MG/ML IJ SOLN
1.0000 ug/kg/h | INTRAVENOUS | Status: DC | PRN
  Filled 2014-01-30: qty 2

## 2014-01-30 MED ORDER — ONDANSETRON HCL 4 MG/2ML IJ SOLN: 4.0000 mg | Freq: Three times a day (TID) | INTRAMUSCULAR | Status: DC | PRN

## 2014-01-30 MED ORDER — METOCLOPRAMIDE HCL 5 MG/ML IJ SOLN: 10.0000 mg | Freq: Three times a day (TID) | INTRAMUSCULAR | Status: DC | PRN

## 2014-01-30 MED ORDER — SENNOSIDES-DOCUSATE SODIUM 8.6-50 MG PO TABS
2.0000 | ORAL_TABLET | ORAL | Status: DC
  Administered 2014-01-30 – 2014-01-31 (×2): 2 via ORAL
  Filled 2014-01-30 (×2): qty 2

## 2014-01-30 MED ORDER — OXYCODONE-ACETAMINOPHEN 5-325 MG PO TABS
1.0000 | ORAL_TABLET | ORAL | Status: DC | PRN
  Administered 2014-01-31: 1 via ORAL
  Filled 2014-01-30: qty 1

## 2014-01-30 MED ORDER — OXYTOCIN 40 UNITS IN LACTATED RINGERS INFUSION - SIMPLE MED: 62.5000 mL/h | INTRAVENOUS | Status: AC

## 2014-01-30 MED ORDER — LACTATED RINGERS IV SOLN
INTRAVENOUS | Status: AC
  Administered 2014-01-30: 11:00:00 via INTRAVENOUS

## 2014-01-30 MED ORDER — DIPHENHYDRAMINE HCL 25 MG PO CAPS: 25.0000 mg | ORAL_CAPSULE | ORAL | Status: DC | PRN

## 2014-01-30 MED ORDER — DIPHENHYDRAMINE HCL 50 MG/ML IJ SOLN: 25.0000 mg | INTRAMUSCULAR | Status: DC | PRN

## 2014-01-30 MED ORDER — PRENATAL MULTIVITAMIN CH
1.0000 | ORAL_TABLET | Freq: Every day | ORAL | Status: DC
  Administered 2014-01-30 – 2014-02-01 (×2): 1 via ORAL
  Filled 2014-01-30 (×3): qty 1

## 2014-01-30 MED ORDER — SODIUM CHLORIDE 0.9 % IJ SOLN: 3.0000 mL | Freq: Two times a day (BID) | INTRAMUSCULAR | Status: DC

## 2014-01-30 MED ORDER — ONDANSETRON HCL 4 MG/2ML IJ SOLN: 4.0000 mg | INTRAMUSCULAR | Status: DC | PRN

## 2014-01-30 MED ORDER — SIMETHICONE 80 MG PO CHEW
80.0000 mg | CHEWABLE_TABLET | Freq: Three times a day (TID) | ORAL | Status: DC
  Administered 2014-01-30 – 2014-02-01 (×7): 80 mg via ORAL
  Filled 2014-01-30 (×7): qty 1

## 2014-01-30 NOTE — Progress Notes (Signed)
POD#1 Pt has no complaints. No headaches , RUQ pain, vision changes.  VSSAF IMP/ Doing well post op Plan/ Will d/c MgSO4 when she has completed 24 hours and transfer to floor.

## 2014-01-30 NOTE — Progress Notes (Signed)
UR chart review completed.  

## 2014-01-30 NOTE — Anesthesia Postprocedure Evaluation (Signed)
  Anesthesia Post-op Note  Anesthesia Post Note  Patient: Veronica Cannon  Procedure(s) Performed: Procedure(s) (LRB): Repeat Cesarean Section Delivery Baby Girl  @ 2112,  Apgars 8/9 (N/A)  Anesthesia type: Spinal  Patient location: AICU  Post pain: Pain level controlled  Post assessment: Post-op Vital signs reviewed  Last Vitals:  Filed Vitals:   01/30/14 0800  BP: 112/72  Pulse: 92  Temp:   Resp:     Post vital signs: Reviewed  Level of consciousness: awake  Complications: No apparent anesthesia complications

## 2014-01-30 NOTE — Lactation Note (Signed)
This note was copied from the chart of Veronica Nakshatra Cannon. Lactation Consultation Note     Initial consult with this mom and baby, now 13 hours post partum, and 36 1/7 weeks corrected gestation. I showed mom  how to hand express, and she return demonstrated with good technique.  Mom encouraged to feed and pump every 3 hours, do lots of skin to skin, and supplement the baby with what she expresses. Baby and Me book latation information reviewed, as well as lactation and community resouces.  Mom advised to  Feed on cue, and at least every 3 hours attempt breast feeding. I also started mom puping with DEP, and advised her to pump every 3 hours also, and cupplemetn baby with what she expressed, either by spoon or bottle. Mom knows to call lactaiton for questions/concerns.  Patient Name: Veronica Cannon ZOXWR'UToday's Date: 01/30/2014     Maternal Data    Feeding Feeding Type: Breast Fed Length of feed: 10 min  LATCH Score/Interventions                      Lactation Tools Discussed/Used     Consult Status      Alfred LevinsLee, Pranavi Aure Anne 01/30/2014, 4:30 PM

## 2014-01-30 NOTE — Plan of Care (Signed)
Problem: Phase I Progression Outcomes Goal: IS, TCDB as ordered Outcome: Completed/Met Date Met:  01/30/14 Able to I/S up to 2500

## 2014-01-30 NOTE — Addendum Note (Signed)
Addendum created 01/30/14 0813 by Turner DanielsJennifer L Nikai Quest, CRNA   Modules edited: Notes Section   Notes Section:  File: [id number]

## 2014-01-31 ENCOUNTER — Encounter (HOSPITAL_COMMUNITY): Payer: Self-pay | Admitting: Obstetrics & Gynecology

## 2014-01-31 MED ORDER — DOCUSATE SODIUM 100 MG PO CAPS: 100.0000 mg | ORAL_CAPSULE | Freq: Two times a day (BID) | ORAL | Status: DC | PRN

## 2014-01-31 MED ORDER — OXYCODONE-ACETAMINOPHEN 5-325 MG PO TABS: 2.0000 | ORAL_TABLET | ORAL | Status: DC | PRN

## 2014-01-31 MED ORDER — IBUPROFEN 600 MG PO TABS: 600.0000 mg | ORAL_TABLET | Freq: Four times a day (QID) | ORAL | Status: DC | PRN

## 2014-01-31 NOTE — Lactation Note (Signed)
This note was copied from the chart of Veronica Cannon. Lactation Consultation Note  Patient Name: Veronica Cannon ZOXWR'UToday's Date: 01/31/2014 Reason for consult: Follow-up assessment based on report from RN, Waynetta SandyBeth who reports that this mom has decided to bottle feed with formula now.  LC follow-up not needed unless mom changes her mind and requests LC.   Maternal Data    Feeding Feeding Type: Bottle Fed - Formula  LATCH Score/Interventions                      Lactation Tools Discussed/Used     Consult Status Consult Status: PRN Date: 02/01/14 Follow-up type: In-patient    Warrick ParisianBryant, Ed [facility] 01/31/2014, 10:56 PM

## 2014-01-31 NOTE — Discharge Summary (Signed)
Obstetric Discharge Summary Reason for Admission: Pre-eclampsia Prenatal Procedures: NST and ultrasound Intrapartum Procedures: cesarean: low cervical, transverse Postpartum Procedures: None Complications-Operative and Postpartum: None Hemoglobin  Date Value Ref Range Status  01/30/2014 9.1* 12.0 - 15.0 g/dL Final     HCT  Date Value Ref Range Status  01/30/2014 30.2* 36.0 - 46.0 % Final    Physical Exam:  General: alert, cooperative and appears stated age 89Lochia: appropriate Uterine Fundus: firm Incision: healing well DVT Evaluation: No evidence of DVT seen on physical exam.  Discharge Diagnoses: Preelampsia and Preterm delivery  Discharge Information: Date: 01/31/2014 Activity: pelvic rest Diet: routine Medications: Ibuprofen, Colace and Percocet Condition: improved Instructions: refer to practice specific booklet Discharge to: home Follow-up Information   Follow up with Levi AlandANDERSON,MARK E, MD. (For a Blood Pressure check)    Specialty:  Obstetrics and Gynecology   Contact information:   [address] [place] [phone]       [facility] MaeWALDA STACIA, MD In 4 weeks. (For a postpartum check)    Specialty:  Obstetrics and Gynecology   Contact information:   [address] [place] [address] [phone]       Newborn Data: Live born female  Birth Weight: 5 lb 13.5 oz (2651 g) APGAR: 8, 9  Home with mother.  Lenisha Lacap H. 01/31/2014, 10:51 AM

## 2014-02-01 MED ORDER — OXYCODONE-ACETAMINOPHEN 5-325 MG PO TABS: 1.0000 | ORAL_TABLET | ORAL | Status: DC | PRN

## 2014-02-01 NOTE — Progress Notes (Signed)
Post discharge ur review completed. 

## 2014-02-01 NOTE — Discharge Summary (Signed)
Obstetric Discharge Summary Reason for Admission: cesarean section Prenatal Procedures: Preeclampsia and ultrasound Intrapartum Procedures: cesarean: low cervical, transverse Postpartum Procedures: none Complications-Operative and Postpartum: none Hemoglobin  Date Value Ref Range Status  01/30/2014 9.1* 12.0 - 15.0 g/dL Final     HCT  Date Value Ref Range Status  01/30/2014 30.2* 36.0 - 46.0 % Final    Physical Exam:  General: alert and cooperative Lochia: appropriate Uterine Fundus: firm Incision: healing well, no significant drainage DVT Evaluation: No evidence of DVT seen on physical exam.  Discharge Diagnoses: Preelampsia, repeat c/s  Discharge Information: Date: 02/01/2014 Activity: pelvic rest Diet: routine Medications: PNV, Ibuprofen and Percocet Condition: stable Instructions: refer to practice specific booklet Discharge to: home Follow-up Information   Follow up with Levi AlandANDERSON,MARK E, MD. (For a Blood Pressure check)    Specialty:  Obstetrics and Gynecology   Contact information:   [address] [place] [phone]       [facility] Sanjuana MaeWALDA STACIA, MD In 4 weeks. (For a postpartum check)    Specialty:  Obstetrics and Gynecology   Contact information:   [address] [place] [address] [phone]       Follow up with Wynonia HazardPINN, WALDA STACIA, MD In 2 weeks.   Specialty:  Obstetrics and Gynecology   Contact information:   [address] [place] [address] [phone]       Newborn Data: Live born female  Birth Weight: 5 lb 13.5 oz (2651 g) APGAR: 8, 9  Home with mother.  Caydon Feasel 02/01/2014, 10:09 AM

## 2014-02-02 LAB — TYPE AND SCREEN
ABO/RH(D): O POS
Antibody Screen: POSITIVE
DAT, IgG: NEGATIVE
UNIT DIVISION: 0
UNIT DIVISION: 0
Unit division: 0
Unit division: 0

## 2014-03-20 ENCOUNTER — Encounter (INDEPENDENT_AMBULATORY_CARE_PROVIDER_SITE_OTHER): Payer: Self-pay | Admitting: Surgery

## 2014-03-20 ENCOUNTER — Ambulatory Visit (INDEPENDENT_AMBULATORY_CARE_PROVIDER_SITE_OTHER): Payer: Medicaid Other | Admitting: Surgery

## 2014-03-20 VITALS — BP 110/65 | HR 77 | Temp 98.4°F | Resp 16 | Ht 66.0 in | Wt 161.8 lb

## 2014-03-20 DIAGNOSIS — K5900 Constipation, unspecified: Secondary | ICD-10-CM

## 2014-03-20 DIAGNOSIS — K5909 Other constipation: Secondary | ICD-10-CM | POA: Insufficient documentation

## 2014-03-20 DIAGNOSIS — K648 Other hemorrhoids: Secondary | ICD-10-CM

## 2014-03-20 NOTE — Patient Instructions (Signed)
ANORECTAL SURGERY: POST OP INSTRUCTIONS  1. Take your usually prescribed home medications unless otherwise directed. 2. DIET: Follow a light bland diet the first 24 hours after arrival home, such as soup, liquids, crackers, etc.  Be sure to include lots of fluids daily.  Avoid fast food or heavy meals as your are more likely to get nauseated.  Eat a low fat the next few days after surgery.   3. PAIN CONTROL: a. Pain is best controlled by a usual combination of three different methods TOGETHER: i. Ice/Heat ii. Over the counter pain medication iii. Prescription pain medication b. Most patients will experience some swelling and discomfort in the anus/rectal area. and incisions.  Ice packs or heat (30-60 minutes up to 6 times a day) will help. Use ice for the first few days to help decrease swelling and bruising, then switch to heat such as warm towels, sitz baths, warm baths, etc to help relax tight/sore spots and speed recovery.  Some people prefer to use ice alone, heat alone, alternating between ice & heat.  Experiment to what works for you.  Swelling and bruising can take several weeks to resolve.   c. It is helpful to take an over-the-counter pain medication regularly for the first few weeks.  Choose one of the following that works best for you: i. Naproxen (Aleve, etc)  Two 220mg tabs twice a day ii. Ibuprofen (Advil, etc) Three 200mg tabs four times a day (every meal & bedtime) iii. Acetaminophen (Tylenol, etc) 500-650mg four times a day (every meal & bedtime) d. A  prescription for pain medication (such as oxycodone, hydrocodone, etc) should be given to you upon discharge.  Take your pain medication as prescribed.  i. If you are having problems/concerns with the prescription medicine (does not control pain, nausea, vomiting, rash, itching, etc), please call us [phone] to see if we need to switch you to a different pain medicine that will work better for you and/or control your side effect  better. ii. If you need a refill on your pain medication, please contact your pharmacy.  They will contact our office to request authorization. Prescriptions will not be filled after 5 pm or on week-ends.  Use a Sitz Bath 4-8 times a day for relief A sitz bath is a warm water bath taken in the sitting position that covers only the hips and buttocks. It may be used for either healing or hygiene purposes. Sitz baths are also used to relieve pain, itching, or muscle spasms. The water may contain medicine. Moist heat will help you heal and relax.  HOME CARE INSTRUCTIONS  Take 3 to 4 sitz baths a day. 1. Fill the bathtub half full with warm water. 2. Sit in the water and open the drain a little. 3. Turn on the warm water to keep the tub half full. Keep the water running constantly. 4. Soak in the water for 15 to 20 minutes. 5. After the sitz bath, pat the affected area dry first. SEEK MEDICAL CARE IF:  You get worse instead of better. Stop the sitz baths if you get worse.   4. KEEP YOUR BOWELS REGULAR a. The goal is one bowel movement a day b. Avoid getting constipated.  Between the surgery and the pain medications, it is common to experience some constipation.  Increasing fluid intake and taking a fiber supplement (such as Metamucil, Citrucel, FiberCon, MiraLax, etc) 1-2 times a day regularly will usually help prevent this problem from occurring.  A mild   laxative (prune juice, Milk of Magnesia, MiraLax, etc) should be taken according to package directions if there are no bowel movements after 48 hours. c. Watch out for diarrhea.  If you have many loose bowel movements, simplify your diet to bland foods & liquids for a few days.  Stop any stool softeners and decrease your fiber supplement.  Switching to mild anti-diarrheal medications (Kayopectate, Pepto Bismol) can help.  If this worsens or does not improve, please call us.  5. Wound Care a. Remove your bandages the day after surgery.  Unless  discharge instructions indicate otherwise, leave your bandage dry and in place overnight.  Remove the bandage during your first bowel movement.   b. Allow the wound packing to fall out over the next few days.  You can trim exposed gauze / ribbon as it falls out.  You do not need to repack the wound unless instructed otherwise.  Wear an absorbent pad or soft cotton gauze in your underwear as needed to catch any drainage and help keep the area  c. Keep the area clean and dry.  Bathe / shower every day.  Keep the area clean by showering / bathing over the incision / wound.   It is okay to soak an open wound to help wash it.  Wet wipes or showers / gentle washing after bowel movements is often less traumatic than regular toilet paper. d. You may have some styrofoam-like soft packing in the rectum which will come out with the first bowel movement.  e. You will often notice bleeding with bowel movements.  This should slow down by the end of the first week of surgery f. Expect some drainage.  This should slow down, too, by the end of the first week of surgery.  Wear an absorbent pad or soft cotton gauze in your underwear until the drainage stops. 6. ACTIVITIES as tolerated:   a. You may resume regular (light) daily activities beginning the next day-such as daily self-care, walking, climbing stairs-gradually increasing activities as tolerated.  If you can walk 30 minutes without difficulty, it is safe to try more intense activity such as jogging, treadmill, bicycling, low-impact aerobics, swimming, etc. b. Save the most intensive and strenuous activity for last such as sit-ups, heavy lifting, contact sports, etc  Refrain from any heavy lifting or straining until you are off narcotics for pain control.   c. DO NOT PUSH THROUGH PAIN.  Let pain be your guide: If it hurts to do something, don't do it.  Pain is your body warning you to avoid that activity for another week until the pain goes down. d. You may drive when  you are no longer taking prescription pain medication, you can comfortably sit for long periods of time, and you can safely maneuver your car and apply brakes. e. You may have sexual intercourse when it is comfortable.  7. FOLLOW UP in our office a. Please call [facility] at [phone] to set up an appointment to see your surgeon in the office for a follow-up appointment approximately 2 weeks after your surgery. b. Make sure that you call for this appointment the day you arrive home to insure a convenient appointment time. 10. IF YOU HAVE DISABILITY OR FAMILY LEAVE FORMS, BRING THEM TO THE OFFICE FOR PROCESSING.  DO NOT GIVE THEM TO YOUR DOCTOR.        WHEN TO CALL US [phone]: 1. Poor pain control 2. Reactions / problems with new medications (rash/itching, nausea, etc)    3. Fever over 101.5 F (38.5 C) 4. Inability to urinate 5. Nausea and/or vomiting 6. Worsening swelling or bruising 7. Continued bleeding from incision. 8. Increased pain, redness, or drainage from the incision  The clinic staff is available to answer your questions during regular business hours (8:30am-5pm).  Please don't hesitate to call and ask to speak to one of our nurses for clinical concerns.   A surgeon from [facility] is always on call at the hospitals   If you have a medical emergency, go to the nearest emergency room or call 911.    [facility] [address], [address], [place],   [address] ? MAIN: [phone] ? TOLL FREE: [phone] ? FAX [phone] [email or web address]  HEMORRHOIDS  The rectum is the last foot of your colon, and it naturally stretches to hold stool.  Hemorrhoidal piles are natural clusters of blood vessels that help the rectum and anal canal stretch to hold stool and allow bowel movements to eliminate feces.   Hemorrhoids are abnormally swollen blood vessels in the rectum.  Too much pressure in the rectum causes  hemorrhoids by forcing blood to stretch and bulge the walls of the veins, sometimes even rupturing them.  Hemorrhoids can become like varicose veins you might see on a person's legs.  Most people will develop a flare of hemorrhoids in their lifetime.  When bulging hemorrhoidal veins are irritated, they can swell, burn, itch, cause pain, and bleed.  Most flares will calm down gradually own within a few weeks.  However, once hemorrhoids are created, they are difficult to get rid of completely and tend to flare more easily than the first flare.   Fortunately, good habits and simple medical treatment usually control hemorrhoids well, and surgery is needed only in severe cases. Types of Hemorrhoids:  Internal hemorrhoids usually don't initially hurt or itch; they are deep inside the rectum and usually have no sensation. If they begin to push out (prolapse), pain and burning can occur.  However, internal hemorrhoids can bleed.  Anal bleeding should not be ignored since bleeding could come from a dangerous source like colorectal cancer, so persistent rectal bleeding should be investigated by a doctor, sometimes with a colonoscopy.  External hemorrhoids cause most of the symptoms - pain, burning, and itching. Nonirritated hemorrhoids can look like small skin tags coming out of the anus.   Thrombosed hemorrhoids can form when a hemorrhoid blood vessel bursts and causes the hemorrhoid to suddenly swell.  A purple blood clot can form in it and become an excruciatingly painful lump at the anus. Because of these unpleasant symptoms, immediate incision and drainage by a surgeon at an office visit can provide much relief of the pain.    PREVENTION Avoiding the most frequent causes listed below will prevent most cases of hemorrhoids: Constipation Hard stools Diarrhea  Constant sitting  Straining with bowel movements Sitting on the toilet for a long time  Severe coughing  episodes Pregnancy / Childbirth  Heavy  Lifting  Sometimes avoiding the above triggers is difficult:  How can you avoid sitting all day if you have a seated job? Also, we try to avoid coughing and diarrhea, but sometimes it's beyond your control.  Still, there are some practical hints to help: Keep the anal and genital area clean.  Moistened tissues such as flushable wet wipes are less irritating than toilet paper.  Using irrigating showers or bottle irrigation washing gently cleans this sensitive area.     Avoid dry toilet paper when cleaning after bowel movements.  . Keep the anal and genital area dry.  Lightly pat the rectal area dry.  Avoid rubbing.  Talcum or baby powders can help GET YOUR STOOLS SOFT.   This is the most important way to prevent irritated hemorrhoids.  Hard stools are like sandpaper to the anorectal canal and will cause more problems.  The goal: ONE SOFT BOWEL MOVEMENT A DAY!  BMs from every other day to 3 times a day is a tolerable range Treat coughing, diarrhea and constipation early since irritated hemorrhoids may soon follow.  If your main job activity is seated, always stand or walk during your breaks. Make it a point to stand and walk at least 5 minutes every hour and try to shift frequently in your chair to avoid direct rectal pressure.  Always exhale as you strain or lift. Don't hold your breath.  Do not delay or try to prevent a bowel movement when the urge is present. Exercise regularly (walking or jogging 60 minutes a day) to stimulate the bowels to move. No reading or other activity while on the toilet. If bowel movements take longer than 5 minutes, you are too constipated. AVOID CONSTIPATION Drink plenty of liquids (1 1/2 to 2 quarts of water and other fluids a day unless fluid restricted for another medical condition). Liquids that contain caffeine (coffee a, tea, soft drinks) can be dehydrating and should be avoided until constipation is controlled. Consider minimizing milk, as dairy products may be  constipating. Eat plenty of fiber (30g a day ideal, more if needed).  Fiber is the undigested part of plant food that passes into the colon, acting as "natures broom" to encourage bowel motility and movement.  Fiber can absorb and hold large amounts of water. This results in a larger, bulkier stool, which is soft and easier to pass.  Eating foods high in fiber - 12 servings - such as  Vegetables: Root (potatoes, carrots, turnips), Leafy green (lettuce, salad greens, celery, spinach), High residue (cabbage, broccoli, etc.) Fruit: Fresh, Dried (prunes, apricots, cherries), Stewed (applesauce)  Whole grain breads, pasta, whole wheat Bran cereals, muffins, etc. Consider adding supplemental bulking fiber which retains large volumes of water: Psyllium ground seeds (native plant from [place])--available as Metamucil, Konsyl, Effersyllium, Per Diem Fiber, or the less expensive generic forms.  Citrucel  (methylcellulose wood fiber) . FiberCon (Polycarbophil) Polyethylene Glycol - and "artificial" fiber commonly called Miralax or Glycolax.  It is helpful for people with gassy or bloated feelings with regular fiber Flax Seed - a less gassy natural fiber  Laxatives can be useful for a short period if constipation is severe Osmotics (Milk of Magnesia, Fleets Phospho-Soda, Magnesium Citrate)  Stimulants (Senokot,   Castor Oil,  Dulcolax, Ex-Lax)    Laxatives are not a good long-term solution as it can stress the bowels and cause too much mineral loss and dehydration.   Avoid taking laxatives for more than 7 days in a row.  AVOID DIARRHEA Switch to liquids and simpler foods for a few days to avoid stressing your intestines further. Avoid dairy products (especially milk & ice cream) for a short time.  The intestines often can lose the ability to digest lactose when stressed. Avoid foods that cause gassiness or bloating.  Typical foods include beans and other legumes, cabbage, broccoli, and dairy foods.   Every person has some sensitivity to other foods, so listen to your body and avoid those foods that trigger problems for   you. Adding fiber (Citrucel, Metamucil, FiberCon, Flax seed, Miralax) gradually can help thicken stools by absorbing excess fluid and retrain the intestines to act more normally.  Slowly increase the dose over a few weeks.  Too much fiber too soon can backfire and cause cramping & bloating. Probiotics (such as active yogurt, Align, etc) may help repopulate the intestines and colon with normal bacteria and calm down a sensitive digestive tract.  Most studies show it to be of mild help, though, and such products can be costly. Medicines: Bismuth subsalicylate (ex. Kayopectate, Pepto Bismol) every 30 minutes for up to 6 doses can help control diarrhea.  Avoid if pregnant. Loperamide (Immodium) can slow down diarrhea.  Start with two tablets (4mg total) first and then try one tablet every 6 hours.  Avoid if you are having fevers or severe pain.  If you are not better or start feeling worse, stop all medicines and call your doctor for advice Call your doctor if you are getting worse or not better.  Sometimes further testing (cultures, endoscopy, X-ray studies, bloodwork, etc) may be needed to help diagnose and treat the cause of the diarrhea. TREATMENT OF HEMORRHOID FLARE If these preventive measures fail, you must take action right away! Hemorrhoids are one condition that can be mild in the morning and become intolerable by nightfall. Most hemorrhoidal flares take several weeks to calm down.  These suggestions can help: Warm soaks.  This helps more than any topical medication.  Use up to 8 times a day.  Usually sitz baths or sitting in a warm bathtub helps.  Sitting on moist warm towels are helpful.  Switching to ice packs/cool compresses can be helpful  Use a Sitz Bath 4-8 times a day for relief A sitz bath is a warm water bath taken in the sitting position that covers only the hips and  buttocks. It may be used for either healing or hygiene purposes. Sitz baths are also used to relieve pain, itching, or muscle spasms. The water may contain medicine. Moist heat will help you heal and relax.  HOME CARE INSTRUCTIONS  Take 3 to 4 sitz baths a day. 6. Fill the bathtub half full with warm water. 7. Sit in the water and open the drain a little. 8. Turn on the warm water to keep the tub half full. Keep the water running constantly. 9. Soak in the water for 15 to 20 minutes. 10. After the sitz bath, pat the affected area dry first. SEEK MEDICAL CARE IF:  You get worse instead of better. Stop the sitz baths if you get worse.  Normalize your bowels.  Extremes of diarrhea or constipation will make hemorrhoids worse.  One soft bowel movement a day is the goal.  Fiber can help get your bowels regular Wet wipes instead of toilet paper Pain control with a NSAID such as ibuprofen (Advil) or naproxen (Aleve) or acetaminophen (Tylenol) around the clock.  Narcotics are constipating and should be minimized if possible Topical creams contain steroids (bydrocortisone) or local anesthetic (xylocaine) can help make pain and itching more tolerable.   EVALUATION If hemorrhoids are still causing problems, you could benefit by an evaluation by a surgeon.  The surgeon will obtain a history and examine you.  If hemorrhoids are diagnosed, some therapies can be offered in the office, usually with an anoscope into the less sensitive area of the rectum: -injection of hemorrhoids (sclerotherapy) can scar the blood vessels of the swollen/enlarged hemorrhoids to help shrink them down to   a more normal size -rubber banding of the enlarged hemorrhoids to help shrink them down to a more normal size -drainage of the blood clot causing a thrombosed hemorrhoid,  to relieve the severe pain   While 90% of the time such problems from hemorrhoids can be managed without preceding to surgery, sometimes the hemorrhoids require a  operation to control the problem (uncontrolled bleeding, prolapse, pain, etc.).   This involves being placed under general anesthesia where the surgeon can confirm the diagnosis and remove, suture, or staple the hemorrhoid(s).  Your surgeon can help you treat the problem appropriately.    

## 2014-03-20 NOTE — Progress Notes (Signed)
Subjective:     Patient ID: Veronica Cannon, female   DOB: 04-Mar-1986, 28 y.o.   MRN: [id number]  HPI  Note: This dictation was prepared with Dragon/digital dictation along with [facility] technology. Any transcriptional errors that result from this process are unintentional.       Gwenn Teodoro  [birth year] [id number]  Patient Care Team: Levi Aland, MD as PCP - General (Obstetrics and Gynecology) Essie Hart, MD as Consulting Physician (Obstetrics and Gynecology)  This patient is a 28 y.o.female who presents today for surgical evaluation at the request of Dr. Mora Appl.   Reason for visit: Hemorrhoids with bleeding  Pleasant woman originally from [place]  Had a hemorrhoid flares in the last first trimester of her last pregnancy.  Still has some intermittent bleeding.  Some occasional pains about wound.  Has not output every other day.  Occasional constipation.  Over-the-counter creams used have not helped.  She has never had any anorectal interventions.  No fevers or chills.  No personal nor family history of GI/colon cancer, inflammatory bowel disease, irritable bowel syndrome, allergy such as Celiac Sprue, dietary/dairy problems, colitis, ulcers nor gastritis.  No recent sick contacts/gastroenteritis.  No travel outside the country.  No changes in diet.  No dysphagia to solids or liquids.  No significant heartburn or reflux.  No hematochezia, hematemesis, coffee ground emesis.  No evidence of prior gastric/peptic ulceration.    Patient Active Problem List   Diagnosis Date Noted  . Hemorrhoids, internal, with bleeding & occ prolapse 03/20/2014  . Constipation, chronic 03/20/2014    Past Medical History  Diagnosis Date  . Gestational diabetes mellitus, antepartum   . Pre-eclampsia, severe, delivered 01/30/2014    Past Surgical History  Procedure Laterality Date  . Cesarean section    . Cesarean section N/A 01/29/2014    Procedure: Repeat Cesarean Section Delivery Baby  Girl  @ 2112,  Apgars 8/9;  Surgeon: Essie Hart, MD;  Location: [facility];  Service: Obstetrics;  Laterality: N/A;    History   Social History  . Marital Status: Single    Spouse Name: N/A    Number of Children: N/A  . Years of Education: N/A   Occupational History  . Not on file.   Social History Main Topics  . Smoking status: Never Smoker   . Smokeless tobacco: Not on file  . Alcohol Use: No  . Drug Use: No  . Sexual Activity: Yes   Other Topics Concern  . Not on file   Social History Narrative  . No narrative on file    Family History  Problem Relation Age of Onset  . Cancer Other   . Diabetes Other   . Hypertension Other   . Heart disease Other     No current outpatient prescriptions on file.   No current facility-administered medications for this visit.     No Known Allergies  BP 110/65  Pulse 77  Temp(Src) 98.4 F (36.9 C)  Resp 16  Ht 5\' 6"  (1.676 m)  Wt 161 lb 12.8 oz (73.392 kg)  BMI 26.13 kg/m2  No results found.   Review of Systems  Constitutional: Negative for fever, chills, diaphoresis, appetite change and fatigue.  HENT: Negative for ear discharge, ear pain, sore throat and trouble swallowing.   Eyes: Negative for photophobia, discharge and visual disturbance.  Respiratory: Negative for cough, choking, chest tightness and shortness of breath.   Cardiovascular: Negative for chest pain and palpitations.  Gastrointestinal: Positive for constipation and anal bleeding.  Negative for nausea, vomiting, abdominal pain, diarrhea and rectal pain.  Endocrine: Negative for cold intolerance and heat intolerance.  Genitourinary: Negative for dysuria, frequency and difficulty urinating.  Musculoskeletal: Negative for gait problem, myalgias and neck pain.  Skin: Negative for color change, pallor and rash.  Allergic/Immunologic: Negative for environmental allergies, food allergies and immunocompromised state.  Neurological: Negative for dizziness, speech  difficulty, weakness and numbness.  Hematological: Negative for adenopathy.  Psychiatric/Behavioral: Negative for confusion and agitation. The patient is not nervous/anxious.        Objective:   Physical Exam  Constitutional: She is oriented to person, place, and time. She appears well-developed and well-nourished. No distress.  HENT:  Head: Normocephalic.  Mouth/Throat: Oropharynx is clear and moist. No oropharyngeal exudate.  Eyes: Conjunctivae and EOM are normal. Pupils are equal, round, and reactive to light. No scleral icterus.  Neck: Normal range of motion. Neck supple. No tracheal deviation present.  Cardiovascular: Normal rate, regular rhythm and intact distal pulses.   Pulmonary/Chest: Effort normal and breath sounds normal. No stridor. No respiratory distress. She exhibits no tenderness.  Abdominal: Soft. She exhibits no distension and no mass. There is no tenderness. Hernia confirmed negative in the right inguinal area and confirmed negative in the left inguinal area.  Genitourinary: No vaginal discharge found.  Exam done with assistance of female Medical Assistant in the room.  Perianal skin clean with good hygiene.  No pruritis.  No pilonidal disease.  No fissure.  No abscess/fistula.  High -normal sphincter tone.  Tolerates digital and anoscopic rectal exam.  No rectal masses.    Right posterior and right anterior internal hemorrhoids moderate with mild prolapse.  Left lateral internal pile smaller.  Musculoskeletal: Normal range of motion. She exhibits no tenderness.       Right elbow: She exhibits normal range of motion.       Left elbow: She exhibits normal range of motion.       Right wrist: She exhibits normal range of motion.       Left wrist: She exhibits normal range of motion.       Right hand: Normal strength noted.       Left hand: Normal strength noted.  Lymphadenopathy:       Head (right side): No posterior auricular adenopathy present.       Head (left side):  No posterior auricular adenopathy present.    She has no cervical adenopathy.    She has no axillary adenopathy.       Right: No inguinal adenopathy present.       Left: No inguinal adenopathy present.  Neurological: She is alert and oriented to person, place, and time. No cranial nerve deficit. She exhibits normal muscle tone. Coordination normal.  Skin: Skin is warm and dry. No rash noted. She is not diaphoretic. No erythema.  Psychiatric: She has a normal mood and affect. Her behavior is normal. Judgment and thought content normal.       Assessment:     Internal hemorrhoids with bleeding and mild partial prolapse     Plan:     After discussion of options and bowel regimen to improve mild/moderate constipation, I recommended banding first.  She agreed.  If this does not work, consider THD hemorrhoidal ligation/pexy.  The anatomy & physiology of the anorectal region was discussed.  The pathophysiology of hemorrhoids and differential diagnosis was discussed.  Natural history progression  was discussed.   I stressed the importance of a bowel regimen  to have daily soft bowel movements to minimize progression of disease.     The patient's symptoms are not adequately controlled.  Therefore, I recommended banding to treat the hemorrhoids.  I went over the technique, risks, benefits, and alternatives.   Goals of post-operative recovery were discussed as well.  Questions were answered.  The patient expressed understanding & wished to proceed.  The patient was positioned in the lateral decubitus position.  Perianal & rectal examination was done.  Using anoscopy, I ligated the hemorrhoids above the dentate line with banding (R ant & post.  L lateral did not hold band.  The patient tolerated the procedure well.  Educational handouts further explaining the pathology, treatment options, and bowel regimen were given as well.

## 2014-09-04 ENCOUNTER — Encounter (INDEPENDENT_AMBULATORY_CARE_PROVIDER_SITE_OTHER): Payer: Self-pay | Admitting: Surgery

## 2014-10-11 ENCOUNTER — Emergency Department (HOSPITAL_BASED_OUTPATIENT_CLINIC_OR_DEPARTMENT_OTHER)
Admission: EM | Admit: 2014-10-11 | Discharge: 2014-10-12 | Disposition: A | Discharge status: Home / Self Care | Payer: Medicaid Other | Attending: Emergency Medicine | Admitting: Emergency Medicine

## 2014-10-11 ENCOUNTER — Encounter (HOSPITAL_BASED_OUTPATIENT_CLINIC_OR_DEPARTMENT_OTHER): Payer: Self-pay | Admitting: *Deleted

## 2014-10-11 DIAGNOSIS — R079 Chest pain, unspecified: Secondary | ICD-10-CM | POA: Diagnosis present

## 2014-10-11 DIAGNOSIS — R0789 Other chest pain: Secondary | ICD-10-CM | POA: Diagnosis not present

## 2014-10-11 DIAGNOSIS — Z8632 Personal history of gestational diabetes: Secondary | ICD-10-CM | POA: Insufficient documentation

## 2014-10-11 MED ORDER — IBUPROFEN 800 MG PO TABS
800.0000 mg | ORAL_TABLET | Freq: Once | ORAL | Status: AC
  Administered 2014-10-11: 800 mg via ORAL
  Filled 2014-10-11: qty 1

## 2014-10-11 NOTE — ED Provider Notes (Signed)
CSN: [id number]     Arrival date & time 10/11/14  2249 History  This chart was scribed for Dione Booze, MD by Richarda Overlie, ED Scribe. This patient was seen in room MH08/MH08 and the patient's care was started 11:40 PM.    Chief Complaint  Patient presents with  . Chest Pain   HPI HPI Comments: Anne-Marie Genson is a 28 y.o. female who presents to the Emergency Department complaining of CP that started at 5:30PM today when pt was driving. She describes the pain as sharp and left sided under her left breast and says it lasted for about 30 minutes. Pt rates the pain was a 9/10 during the episode. Pt states the pain did not radiate to her arm or up her neck. She states movement and deep breathing worsened her pain during the episode. Pt reports she was diaphoretic and mildly SOB at that time. She states she took tylenol PTA. She denies any recent cold symptoms. Pt reports a history of gestational DM. She reports no history of smoking. She denies nausea.    Past Medical History  Diagnosis Date  . Gestational diabetes mellitus, antepartum   . Pre-eclampsia, severe, delivered 01/30/2014   Past Surgical History  Procedure Laterality Date  . Cesarean section    . Cesarean section N/A 01/29/2014    Procedure: Repeat Cesarean Section Delivery Baby Girl  @ 2112,  Apgars 8/9;  Surgeon: Essie Hart, MD;  Location: [facility];  Service: Obstetrics;  Laterality: N/A;   Family History  Problem Relation Age of Onset  . Cancer Other   . Diabetes Other   . Hypertension Other   . Heart disease Other    History  Substance Use Topics  . Smoking status: Never Smoker   . Smokeless tobacco: Not on file  . Alcohol Use: No   OB History    Gravida Para Term Preterm AB TAB SAB Ectopic Multiple Living   2 2 1 1      2      Review of Systems  HENT: Negative for congestion and sore throat.   Cardiovascular: Positive for chest pain.  Gastrointestinal: Negative for nausea and vomiting.  All other systems  reviewed and are negative.     Allergies  Review of patient's allergies indicates no known allergies.  Home Medications   Prior to Admission medications   Not on File   BP 139/89 mmHg  Pulse 70  Temp(Src) 97.7 F (36.5 C) (Oral)  Resp 18  Ht 5\' 6"  (1.676 m)  Wt 152 lb (68.947 kg)  BMI 24.55 kg/m2  SpO2 98%  LMP 09/11/2014  Breastfeeding? No Physical Exam  Constitutional: She is oriented to person, place, and time. She appears well-developed and well-nourished.  HENT:  Head: Normocephalic and atraumatic.  Eyes: EOM are normal. Pupils are equal, round, and reactive to light. Right eye exhibits no discharge. Left eye exhibits no discharge.  Neck: Neck supple. No JVD present.  Cardiovascular: Normal rate, regular rhythm and normal heart sounds.   No murmur heard. Pulmonary/Chest: Effort normal and breath sounds normal. She has no wheezes. She has no rales.  Moderate tenderness left parasternal area which reproduces her pain.   Abdominal: Soft. Bowel sounds are normal. She exhibits no distension and no mass. There is no tenderness.  Musculoskeletal: Normal range of motion. She exhibits no edema.  Lymphadenopathy:    She has no cervical adenopathy.  Neurological: She is alert and oriented to person, place, and time. She has normal reflexes.  No cranial nerve deficit. Coordination normal.  Skin: Skin is warm and dry. No rash noted.  Psychiatric: She has a normal mood and affect. Her behavior is normal. Thought content normal.  Nursing note and vitals reviewed.   ED Course  Procedures   DIAGNOSTIC STUDIES: Oxygen Saturation is 98% on RA, normal by my interpretation.    COORDINATION OF CARE: 11:45 PM Discussed treatment plan with pt at bedside and pt agreed to plan.   Labs Review Results for orders placed or performed during the hospital encounter of 10/11/14  Basic metabolic panel  Result Value Ref Range   Sodium 142 137 - 147 mEq/L   Potassium 4.0 3.7 - 5.3 mEq/L    Chloride 103 96 - 112 mEq/L   CO2 26 19 - 32 mEq/L   Glucose, Bld 97 70 - 99 mg/dL   BUN 14 6 - 23 mg/dL   Creatinine, Ser 9.140.80 0.50 - 1.10 mg/dL   Calcium 9.4 8.4 - 78.210.5 mg/dL   GFR calc non Af Amer >90 >90 mL/min   GFR calc Af Amer >90 >90 mL/min   Anion gap 13 5 - 15  Troponin I  Result Value Ref Range   Troponin I <0.30 <0.30 ng/mL  CBC with Differential  Result Value Ref Range   WBC 6.3 4.0 - 10.5 K/uL   RBC 5.70 (H) 3.87 - 5.11 MIL/uL   Hemoglobin 11.6 (L) 12.0 - 15.0 g/dL   HCT 95.637.4 21.336.0 - 08.646.0 %   MCV 65.6 (L) 78.0 - 100.0 fL   MCH 20.4 (L) 26.0 - 34.0 pg   MCHC 31.0 30.0 - 36.0 g/dL   RDW 57.815.7 (H) 46.911.5 - 62.915.5 %   Platelets 280 150 - 400 K/uL   Neutrophils Relative % 39 (L) 43 - 77 %   Lymphocytes Relative 48 (H) 12 - 46 %   Monocytes Relative 9 3 - 12 %   Eosinophils Relative 3 0 - 5 %   Basophils Relative 1 0 - 1 %   Neutro Abs 2.5 1.7 - 7.7 K/uL   Lymphs Abs 2.9 0.7 - 4.0 K/uL   Monocytes Absolute 0.6 0.1 - 1.0 K/uL   Eosinophils Absolute 0.2 0.0 - 0.7 K/uL   Basophils Absolute 0.1 0.0 - 0.1 K/uL   RBC Morphology STOMATOCYTES    Imaging Review Dg Chest 2 View  10/12/2014   CLINICAL DATA:  LEFT chest pain and shortness of breath beginning 7 hr ago.  EXAM: CHEST  2 VIEW  COMPARISON:  None.  FINDINGS: Cardiomediastinal silhouette is unremarkable. The lungs are clear without pleural effusions or focal consolidations. Trachea projects midline and there is no pneumothorax. Soft tissue planes and included osseous structures are non-suspicious.  IMPRESSION: No acute cardiopulmonary process ; normal chest radiograph.   Electronically Signed   By: Awilda Metroourtnay  Bloomer   On: 10/12/2014 00:45     EKG Interpretation   Date/Time:  Wednesday October 11 2014 22:58:09 EST Ventricular Rate:  72 PR Interval:  160 QRS Duration: 90 QT Interval:  406 QTC Calculation: 444 R Axis:   53 Text Interpretation:  Normal sinus rhythm Normal ECG No old tracing to  compare Confirmed by  [facility] LP[facility]  MD, Asante Blanda ([id number]) on 10/11/2014 11:00:38 PM      MDM   Final diagnoses:  Chest pain, unspecified chest pain type  Chest wall pain    Chest pain which appears to be chest wall pain. ECG and chest x-ray are unremarkable and screening labs were normal.  Patient is reassured and given a prescription for naproxen.   I personally performed the services described in this documentation, which was scribed in my presence. The recorded information has been reviewed and is accurate.       Dione Boozeavid Kayela Humphres, MD 10/12/14 [phone]

## 2014-10-11 NOTE — ED Notes (Signed)
Pt reports CP that radiated down and around her (L) breast that occurred at 530pm and lasted about 30 minutes.  Denies SOB, N/V.

## 2014-10-12 ENCOUNTER — Emergency Department (HOSPITAL_BASED_OUTPATIENT_CLINIC_OR_DEPARTMENT_OTHER): Payer: Medicaid Other

## 2014-10-12 LAB — CBC WITH DIFFERENTIAL/PLATELET
Basophils Absolute: 0.1 10*3/uL (ref 0.0–0.1)
Basophils Relative: 1 % (ref 0–1)
EOS PCT: 3 % (ref 0–5)
Eosinophils Absolute: 0.2 10*3/uL (ref 0.0–0.7)
HCT: 37.4 % (ref 36.0–46.0)
HEMOGLOBIN: 11.6 g/dL — AB (ref 12.0–15.0)
LYMPHS ABS: 2.9 10*3/uL (ref 0.7–4.0)
Lymphocytes Relative: 48 % — ABNORMAL HIGH (ref 12–46)
MCH: 20.4 pg — AB (ref 26.0–34.0)
MCHC: 31 g/dL (ref 30.0–36.0)
MCV: 65.6 fL — AB (ref 78.0–100.0)
MONOS PCT: 9 % (ref 3–12)
Monocytes Absolute: 0.6 10*3/uL (ref 0.1–1.0)
NEUTROS PCT: 39 % — AB (ref 43–77)
Neutro Abs: 2.5 10*3/uL (ref 1.7–7.7)
Platelets: 280 10*3/uL (ref 150–400)
RBC: 5.7 MIL/uL — AB (ref 3.87–5.11)
RDW: 15.7 % — ABNORMAL HIGH (ref 11.5–15.5)
WBC: 6.3 10*3/uL (ref 4.0–10.5)

## 2014-10-12 LAB — BASIC METABOLIC PANEL
Anion gap: 13 (ref 5–15)
BUN: 14 mg/dL (ref 6–23)
CHLORIDE: 103 meq/L (ref 96–112)
CO2: 26 mEq/L (ref 19–32)
CREATININE: 0.8 mg/dL (ref 0.50–1.10)
Calcium: 9.4 mg/dL (ref 8.4–10.5)
GFR calc Af Amer: >90 mL/min (ref >90)
GFR calc non Af Amer: >90 mL/min (ref >90)
Glucose, Bld: 97 mg/dL (ref 70–99)
Potassium: 4 mEq/L (ref 3.7–5.3)
Sodium: 142 mEq/L (ref 137–147)

## 2014-10-12 LAB — TROPONIN I: Troponin I: <0.3 ng/mL (ref <0.30)

## 2014-10-12 MED ORDER — NAPROXEN 500 MG PO TABS: 500.0000 mg | ORAL_TABLET | Freq: Two times a day (BID) | ORAL | Status: DC

## 2014-10-12 NOTE — Discharge Instructions (Signed)

## 2014-10-12 NOTE — ED Notes (Signed)
MD at bedside. 

## 2014-11-16 ENCOUNTER — Encounter (HOSPITAL_COMMUNITY): Payer: Self-pay | Admitting: Obstetrics & Gynecology

## 2018-10-11 ENCOUNTER — Ambulatory Visit: Payer: Self-pay | Admitting: General Surgery

## 2018-10-11 NOTE — H&P (View-Only) (Signed)
  History of Present Illness (Tahmir Kleckner MD; 10/11/2018 11:48 AM) The patient is a 32 year old female who presents with a complaint of Rectal bleeding. 32yo F who presents to the office with rectal bleeding with every BM. This has been ocurring for ~ 2yrs. She underwent RBL in the past with no success. She reports reg BM's and occasional straining.    Past Surgical History (Kelly Dockery, LPN; 10/11/2018 11:39 AM) Cesarean Section - Multiple   Diagnostic Studies History (Kelly Dockery, LPN; 10/11/2018 11:39 AM) Colonoscopy  never Mammogram  never Pap Smear  1-5 years ago  Allergies (Kelly Dockery, LPN; 10/11/2018 11:41 AM) No Known Allergies [10/11/2018]:  Medication History (Kelly Dockery, LPN; 10/11/2018 11:41 AM) metFORMIN HCl ER (500MG Tablet ER 24HR, Oral) Active. Medications Reconciled  Social History (Kelly Dockery, LPN; 10/11/2018 11:39 AM) Alcohol use  Occasional alcohol use. Caffeine use  Coffee. No drug use  Tobacco use  Never smoker.  Family History (Kelly Dockery, LPN; 10/11/2018 11:39 AM) Arthritis  Mother. Bleeding disorder  Father, Mother. Cancer  Mother. Cerebrovascular Accident  Father. Colon Polyps  Father. Diabetes Mellitus  Brother, Father, Mother. Heart Disease  Brother, Father. Heart disease in female family member before age 55  Hypertension  Father. Kidney Disease  Brother, Father, Mother, Sister. Migraine Headache  Mother, Sister.  Pregnancy / Birth History (Kelly Dockery, LPN; 10/11/2018 11:39 AM) Age at menarche  15 years. Gravida  2 Irregular periods  Length (months) of breastfeeding  3-6 Maternal age  26-30 Para  2  Other Problems (Kelly Dockery, LPN; 10/11/2018 11:39 AM) Arthritis  Back Pain  Chest pain  Diabetes Mellitus  Gastroesophageal Reflux Disease  Hemorrhoids  Sickle cell disease     Review of Systems (Kelly Dockery LPN; 10/11/2018 11:39 AM) General Present- Chills and Fatigue. Not  Present- Appetite Loss, Fever, Night Sweats, Weight Gain and Weight Loss. Skin Present- Jaundice. Not Present- Change in Wart/Mole, Dryness, Hives, New Lesions, Non-Healing Wounds, Rash and Ulcer. HEENT Present- Seasonal Allergies and Wears glasses/contact lenses. Not Present- Earache, Hearing Loss, Hoarseness, Nose Bleed, Oral Ulcers, Ringing in the Ears, Sinus Pain, Sore Throat, Visual Disturbances and Yellow Eyes. Cardiovascular Present- Leg Cramps. Not Present- Chest Pain, Difficulty Breathing Lying Down, Palpitations, Rapid Heart Rate, Shortness of Breath and Swelling of Extremities. Gastrointestinal Present- Bloating, Bloody Stool, Hemorrhoids, Indigestion, Nausea and Rectal Pain. Not Present- Abdominal Pain, Change in Bowel Habits, Chronic diarrhea, Constipation, Difficulty Swallowing, Excessive gas, Gets full quickly at meals and Vomiting. Musculoskeletal Present- Back Pain, Joint Pain and Muscle Weakness. Not Present- Joint Stiffness, Muscle Pain and Swelling of Extremities. Neurological Not Present- Decreased Memory, Fainting, Headaches, Numbness, Seizures, Tingling, Tremor, Trouble walking and Weakness. Psychiatric Not Present- Anxiety, Bipolar, Change in Sleep Pattern, Depression, Fearful and Frequent crying. Endocrine Present- New Diabetes. Not Present- Cold Intolerance, Excessive Hunger, Hair Changes, Heat Intolerance and Hot flashes. Hematology Not Present- Blood Thinners, Easy Bruising, Excessive bleeding, Gland problems, HIV and Persistent Infections.  Vitals (Kelly Dockery LPN; 10/11/2018 11:41 AM) 10/11/2018 11:40 AM Weight: 180 lb Height: 66in Body Surface Area: 1.91 m Body Mass Index: 29.05 kg/m  Temp.: 98.4F(Oral)  Pulse: 84 (Regular)  BP: 120/82 (Sitting, Left Arm, Standard)       Physical Exam (Jamiyah Dingley MD; 10/11/2018 11:49 AM) General Mental Status-Alert. General Appearance-Not in acute distress. Build & Nutrition-Well  nourished. Posture-Normal posture. Gait-Normal.  Head and Neck Head-normocephalic, atraumatic with no lesions or palpable masses. Trachea-midline.  Chest and Lung Exam Chest and lung exam reveals -  on auscultation, normal breath sounds, no adventitious sounds and normal vocal resonance.  Cardiovascular Cardiovascular examination reveals -normal heart sounds, regular rate and rhythm with no murmurs and no digital clubbing, cyanosis, edema, increased warmth or tenderness.  Abdomen Inspection Inspection of the abdomen reveals - No Hernias. Palpation/Percussion Palpation and Percussion of the abdomen reveal - Soft, Non Tender, No Rigidity (guarding), No hepatosplenomegaly and No Palpable abdominal masses.  Rectal Anorectal Exam External - skin tag(circumferential). Internal - normal sphincter tone.  Neurologic Neurologic evaluation reveals -alert and oriented x 3 with no impairment of recent or remote memory, normal attention span and ability to concentrate, normal sensation and normal coordination.  Musculoskeletal Normal Exam - Bilateral-Upper Extremity Strength Normal and Lower Extremity Strength Normal.    Assessment & Plan (Safa Derner MD; 10/11/2018 11:54 AM) RECTAL BLEEDING (K62.5) Impression: 32 yo F witrectal bleeding. She has large external hemorrhoids but min internal disease. She does have a small grade 2 R anterior hemorrhoid. I recommended she start a fiber supplement. Given she is having daily rectal bleeding, I have recommended a colonoscopy to rule out a malignancy.  Risks include bleeding, perforation, missed pathology and need for additional procedures.   

## 2018-10-11 NOTE — H&P (Signed)
History of Present Illness Veronica Cannon(Veronica Seaborn MD; 10/11/2018 11:48 AM) The patient is a 32 year old female who presents with a complaint of Rectal bleeding. 32yo F who presents to the office with rectal bleeding with every BM. This has been ocurring for ~ 2952yrs. She underwent RBL in the past with no success. She reports reg BM's and occasional straining.    Past Surgical History Veronica Cannon(Veronica Dockery, LPN; 96/0/454012/07/2018 98:1111:39 AM) Cesarean Section - Multiple   Diagnostic Studies History Veronica Cannon(Veronica Dockery, LPN; 91/4/782912/07/2018 56:2111:39 AM) Colonoscopy  never Mammogram  never Pap Smear  1-5 years ago  Allergies Veronica Cannon(Veronica Dockery, LPN; 30/8/657812/07/2018 46:9611:41 AM) No Known Allergies [10/11/2018]:  Medication History Veronica Cannon(Veronica Dockery, LPN; 29/5/284112/07/2018 32:4411:41 AM) metFORMIN HCl ER (500MG  Tablet ER 24HR, Oral) Active. Medications Reconciled  Social History Veronica Cannon(Veronica Dockery, LPN; 01/0/272512/07/2018 36:6411:39 AM) Alcohol use  Occasional alcohol use. Caffeine use  Coffee. No drug use  Tobacco use  Never smoker.  Family History Veronica Cannon(Veronica Dockery, LPN; 40/3/474212/07/2018 59:5611:39 AM) Arthritis  Mother. Bleeding disorder  Father, Mother. Cancer  Mother. Cerebrovascular Accident  Father. Colon Polyps  Father. Diabetes Mellitus  Brother, Father, Mother. Heart Disease  Brother, Father. Heart disease in female family member before age 34  Hypertension  Father. Kidney Disease  Brother, Father, Mother, Sister. Migraine Headache  Mother, Sister.  Pregnancy / Birth History Veronica Cannon(Veronica Dockery, LPN; 38/7/564312/07/2018 32:9511:39 AM) Age at menarche  15 years. Gravida  2 Irregular periods  Length (months) of breastfeeding  3-6 Maternal age  34-30 Para  2  Other Problems Veronica Cannon(Veronica Dockery, LPN; 18/8/416612/07/2018 06:3011:39 AM) Arthritis  Back Pain  Chest pain  Diabetes Mellitus  Gastroesophageal Reflux Disease  Hemorrhoids  Sickle cell disease     Review of Systems Veronica Cannon(Veronica Dockery LPN; 16/0/109312/07/2018 23:5511:39 AM) General Present- Chills and Fatigue. Not  Present- Appetite Loss, Fever, Night Sweats, Weight Gain and Weight Loss. Skin Present- Jaundice. Not Present- Change in Wart/Mole, Dryness, Hives, New Lesions, Non-Healing Wounds, Rash and Ulcer. HEENT Present- Seasonal Allergies and Wears glasses/contact lenses. Not Present- Earache, Hearing Loss, Hoarseness, Nose Bleed, Oral Ulcers, Ringing in the Ears, Sinus Pain, Sore Throat, Visual Disturbances and Yellow Eyes. Cardiovascular Present- Leg Cramps. Not Present- Chest Pain, Difficulty Breathing Lying Down, Palpitations, Rapid Heart Rate, Shortness of Breath and Swelling of Extremities. Gastrointestinal Present- Bloating, Bloody Stool, Hemorrhoids, Indigestion, Nausea and Rectal Pain. Not Present- Abdominal Pain, Change in Bowel Habits, Chronic diarrhea, Constipation, Difficulty Swallowing, Excessive gas, Gets full quickly at meals and Vomiting. Musculoskeletal Present- Back Pain, Joint Pain and Muscle Weakness. Not Present- Joint Stiffness, Muscle Pain and Swelling of Extremities. Neurological Not Present- Decreased Memory, Fainting, Headaches, Numbness, Seizures, Tingling, Tremor, Trouble walking and Weakness. Psychiatric Not Present- Anxiety, Bipolar, Change in Sleep Pattern, Depression, Fearful and Frequent crying. Endocrine Present- New Diabetes. Not Present- Cold Intolerance, Excessive Hunger, Hair Changes, Heat Intolerance and Hot flashes. Hematology Not Present- Blood Thinners, Easy Bruising, Excessive bleeding, Gland problems, HIV and Persistent Infections.  Vitals Veronica Cannon(Veronica Dockery LPN; 73/2/202512/07/2018 42:7011:41 AM) 10/11/2018 11:40 AM Weight: 180 lb Height: 66in Body Surface Area: 1.91 m Body Mass Index: 29.05 kg/m  Temp.: 98.21F(Oral)  Pulse: 84 (Regular)  BP: 120/82 (Sitting, Left Arm, Standard)       Physical Exam Veronica Cannon(Veronica Lyne MD; 10/11/2018 11:49 AM) General Mental Status-Alert. General Appearance-Not in acute distress. Build & Nutrition-Well  nourished. Posture-Normal posture. Gait-Normal.  Head and Neck Head-normocephalic, atraumatic with no lesions or palpable masses. Trachea-midline.  Chest and Lung Exam Chest and lung exam reveals -  on auscultation, normal breath sounds, no adventitious sounds and normal vocal resonance.  Cardiovascular Cardiovascular examination reveals -normal heart sounds, regular rate and rhythm with no murmurs and no digital clubbing, cyanosis, edema, increased warmth or tenderness.  Abdomen Inspection Inspection of the abdomen reveals - No Hernias. Palpation/Percussion Palpation and Percussion of the abdomen reveal - Soft, Non Tender, No Rigidity (guarding), No hepatosplenomegaly and No Palpable abdominal masses.  Rectal Anorectal Exam External - skin tag(circumferential). Internal - normal sphincter tone.  Neurologic Neurologic evaluation reveals -alert and oriented x 3 with no impairment of recent or remote memory, normal attention span and ability to concentrate, normal sensation and normal coordination.  Musculoskeletal Normal Exam - Bilateral-Upper Extremity Strength Normal and Lower Extremity Strength Normal.    Assessment & Plan Veronica Levee MD; 10/11/2018 11:54 AM) RECTAL BLEEDING (K62.5) Impression: 32 yo F witrectal bleeding. She has large external hemorrhoids but min internal disease. She does have a small grade 2 R anterior hemorrhoid. I recommended she start a fiber supplement. Given she is having daily rectal bleeding, I have recommended a colonoscopy to rule out a malignancy.  Risks include bleeding, perforation, missed pathology and need for additional procedures.

## 2018-10-13 ENCOUNTER — Other Ambulatory Visit: Payer: Self-pay

## 2018-10-13 ENCOUNTER — Encounter (HOSPITAL_COMMUNITY)
Admission: RE | Disposition: A | Discharge status: Home / Self Care | Payer: Self-pay | Source: Home / Self Care | Attending: General Surgery

## 2018-10-13 ENCOUNTER — Encounter (HOSPITAL_COMMUNITY): Payer: Self-pay | Admitting: *Deleted

## 2018-10-13 ENCOUNTER — Ambulatory Visit (HOSPITAL_COMMUNITY)
Admission: RE | Admit: 2018-10-13 | Discharge: 2018-10-13 | Disposition: A | Discharge status: Home / Self Care | Payer: 59 | Attending: General Surgery | Admitting: General Surgery

## 2018-10-13 DIAGNOSIS — Z7984 Long term (current) use of oral hypoglycemic drugs: Secondary | ICD-10-CM | POA: Insufficient documentation

## 2018-10-13 DIAGNOSIS — K625 Hemorrhage of anus and rectum: Secondary | ICD-10-CM | POA: Diagnosis not present

## 2018-10-13 DIAGNOSIS — K644 Residual hemorrhoidal skin tags: Secondary | ICD-10-CM | POA: Insufficient documentation

## 2018-10-13 DIAGNOSIS — D571 Sickle-cell disease without crisis: Secondary | ICD-10-CM | POA: Diagnosis not present

## 2018-10-13 DIAGNOSIS — E119 Type 2 diabetes mellitus without complications: Secondary | ICD-10-CM | POA: Diagnosis not present

## 2018-10-13 HISTORY — PX: COLONOSCOPY: SHX5424

## 2018-10-13 LAB — GLUCOSE, CAPILLARY: GLUCOSE-CAPILLARY: 82 mg/dL (ref 70–99)

## 2018-10-13 SURGERY — COLONOSCOPY
Anesthesia: IV Sedation (MBSC Only)

## 2018-10-13 MED ORDER — MIDAZOLAM HCL (PF) 10 MG/2ML IJ SOLN
INTRAMUSCULAR | Status: DC | PRN
  Administered 2018-10-13: 2 mg via INTRAVENOUS
  Administered 2018-10-13 (×2): 1 mg via INTRAVENOUS
  Administered 2018-10-13 (×2): 2 mg via INTRAVENOUS
  Administered 2018-10-13: 1 mg via INTRAVENOUS

## 2018-10-13 MED ORDER — LACTATED RINGERS IV SOLN
INTRAVENOUS | Status: DC
  Administered 2018-10-13: 1000 mL via INTRAVENOUS

## 2018-10-13 MED ORDER — MIDAZOLAM HCL (PF) 5 MG/ML IJ SOLN
INTRAMUSCULAR | Status: AC
  Filled 2018-10-13: qty 2

## 2018-10-13 MED ORDER — FENTANYL CITRATE (PF) 100 MCG/2ML IJ SOLN
INTRAMUSCULAR | Status: AC
  Filled 2018-10-13: qty 4

## 2018-10-13 MED ORDER — FENTANYL CITRATE (PF) 100 MCG/2ML IJ SOLN
INTRAMUSCULAR | Status: DC | PRN
  Administered 2018-10-13 (×4): 25 ug via INTRAVENOUS

## 2018-10-13 MED ORDER — DIPHENHYDRAMINE HCL 50 MG/ML IJ SOLN
INTRAMUSCULAR | Status: AC
  Filled 2018-10-13: qty 1

## 2018-10-13 NOTE — Op Note (Signed)
[facility] Patient Name: Veronica Cannon Procedure Date: 10/13/2018 MRN: [id number] Attending MD: Romie Levee , MD Date of Birth: 05/09/1986 CSN: [id number] Age: 32 Admit Type: Outpatient Procedure:                Colonoscopy Indications:              Rectal bleeding Providers:                Romie Levee, MD, Dwain Sarna, RN, Beryle Beams, Technician Referring MD:              Medicines:                Fentanyl 100 micrograms IV, Midazolam 10 mg IV Complications:            No immediate complications. Estimated blood loss:                            None. Estimated Blood Loss:      Procedure:                Pre-Anesthesia Assessment:                           - Prior to the procedure, a History and Physical                            was performed, and patient medications and                            allergies were reviewed. The patient's tolerance of                            previous anesthesia was also reviewed. The risks                            and benefits of the procedure and the sedation                            options and risks were discussed with the patient.                            All questions were answered, and informed consent                            was obtained. Prior Anticoagulants: The patient has                            taken no previous anticoagulant or antiplatelet                            agents. After reviewing the risks and benefits, the                            patient was deemed in satisfactory  condition to                            undergo the procedure.                           - ASA Grade Assessment: I - A normal, healthy                            patient.                           After obtaining informed consent, the colonoscope                            was passed under direct vision. Throughout the                            procedure, the patient's blood pressure,  pulse, and                            oxygen saturations were monitored continuously. The                            CF-[id number] ([id number] Olympus adult colonoscope was                            introduced through the anus and advanced to the the                            cecum, identified by appendiceal orifice and                            ileocecal valve. The colonoscopy was performed with                            ease. The ileocecal valve, the appendiceal orifice                            and the rectum were photographed. The patient                            tolerated the procedure well. The quality of the                            bowel preparation was excellent. Scope withdrawal                            time was 8 minutes. Scope In: 2:34:04 PM Scope Out: 2:52:59 PM Scope Withdrawal Time: 0 hours 8 minutes 23 seconds  Total Procedure Duration: 0 hours 18 minutes 55 seconds  Findings:      Skin tags were found on perianal exam.      The exam was otherwise without abnormality on direct and retroflexion       views. Impression:               -  Perianal skin tags found on perianal exam.                           - The examination was otherwise normal on direct                            and retroflexion views.                           - No specimens collected. Moderate Sedation:      Moderate (conscious) sedation was administered by the endoscopy nurse       and supervised by the endoscopist. The following parameters were       monitored: oxygen saturation, heart rate, blood pressure, and response       to care. Recommendation:           - Discharge patient to home (ambulatory).                           - Resume previous diet.                           - Repeat colonoscopy at age 69 for screening                            purposes. Procedure Code(s):        --- Professional ---                           [phone], Colonoscopy, flexible; diagnostic, including                             collection of specimen(s) by brushing or washing,                            when performed (separate procedure) Diagnosis Code(s):        --- Professional ---                           K64.4, Residual hemorrhoidal skin tags                           K62.5, Hemorrhage of anus and rectum CPT copyright 2018 [employer]. All rights reserved. The codes documented in this report are preliminary and upon coder review may  be revised to meet current compliance requirements. Romie Levee, MD Romie Levee, MD 10/13/2018 3:00:34 PM This report has been signed electronically. Number of Addenda: 0

## 2018-10-13 NOTE — Discharge Instructions (Addendum)
Post Colonoscopy Instructions ° °1. DIET: Follow a light bland diet the first 24 hours after arrival home, such as soup, liquids, crackers, etc.  Be sure to include lots of fluids daily.  Avoid fast food or heavy meals as your are more likely to get nauseated.   °2. You may have some mild rectal bleeding for the first few days after the procedure.  This should get less and less with time.  Resume any blood thinners 2 days after your procedure unless directed otherwise by your physician. °3. Take your usually prescribed home medications unless otherwise directed. °a. If you have any pain, it is helpful to get up and walk around, as it is usually from excess gas. °b. If this is not helpful, you can take an over-the-counter pain medication.  Choose one of the following that works best for you: °i. Naproxen (Aleve, etc)  Two 220mg tabs twice a day °ii. Ibuprofen (Advil, etc) Three 200mg tabs four times a day (every meal & bedtime) °iii. If you still have pain after using one of these, please call the office °4. It is normal to not have a bowel movement for 2-3 days after colonoscopy.   ° °5. ACTIVITIES as tolerated:   °6. You may resume regular (light) daily activities beginning the next day--such as daily self-care, walking, climbing stairs--gradually increasing activities as tolerated.  ° ° °WHEN TO CALL US [phone]: °1. Fever over 101.5 F (38.5 C)  °2. Severe abdominal or chest pain  °3. Large amount of rectal bleeding, passing multiple blood clots  °4. Dizziness or shortness of breath °5. Increasing nausea or vomiting ° ° The clinic staff is available to answer your questions during regular business hours (8:30am-5pm).  Please don’t hesitate to call and ask to speak to one of our nurses for clinical concerns.  ° If you have a medical emergency, go to the nearest emergency room or call 911. ° A surgeon from [facility] is always on call at the hospitals ° ° °[facility] °[address], [address], [place], [place]  [address] ? °MAIN: [phone] ? TOLL FREE: [phone] ?  °FAX [phone] °[email or web address] ° ° ° °GETTING TO GOOD BOWEL HEALTH. °Irregular bowel habits such as constipation can lead to many problems over time.  Having one soft bowel movement a day is the most important way to prevent further problems.  The anorectal canal is designed to handle stretching and feces to safely manage our ability to get rid of solid waste (feces, poop, stool) out of our body.  BUT, hard constipated stools can act like ripping concrete bricks causing inflamed hemorrhoids, anal fissures, abdominal pain and bloating.     °The goal: ONE SOFT BOWEL MOVEMENT A DAY!  To have soft, regular bowel movements:  °• Drink at least 8 tall glasses of water a day.   °• Take plenty of fiber.  Fiber is the undigested part of plant food that passes into the colon, acting s “natures broom” to encourage bowel motility and movement.  Fiber can absorb and hold large amounts of water. This results in a larger, bulkier stool, which is soft and easier to pass. Work gradually over several weeks up to 6 servings a day of fiber (25g a day even more if needed) in the form of: °o Vegetables -- Root (potatoes, carrots, turnips), leafy green (lettuce, salad greens, celery, spinach), or cooked high residue (cabbage, broccoli, etc) °o Fruit -- Fresh (unpeeled skin &   pulp), Dried (prunes, apricots, cherries, etc ),  or stewed ( applesauce)  °o Whole grain breads, pasta, etc (whole wheat)  °o Bran cereals  °• Bulking Agents -- This type of water-retaining fiber generally is easily obtained each day by one of the following:  °o Psyllium bran -- The psyllium plant is remarkable because its ground seeds can retain so much water. This product is available as Metamucil, Konsyl, Effersyllium, Per Diem Fiber, or the less expensive generic preparation in drug and health food stores. Although labeled a laxative, it  really is not a laxative.  °o Methylcellulose -- This is another fiber derived from wood which also retains water. It is available as Citrucel. °o Polyethylene Glycol - and “artificial” fiber commonly called Miralax or Glycolax.  It is helpful for people with gassy or bloated feelings with regular fiber °o Flax Seed - a less gassy fiber than psyllium °• No reading or other relaxing activity while on the toilet. If bowel movements take longer than 5 minutes, you are too constipated. °• AVOID CONSTIPATION.  High fiber and water intake usually takes care of this.  Sometimes a laxative is needed to stimulate more frequent bowel movements, but  °• Laxatives are not a good long-term solution as it can wear the colon out. °o Osmotics (Milk of Magnesia, Fleets phosphosoda, Magnesium citrate, MiraLax, GoLytely) are safer than  °o Stimulants (Senokot, Castor Oil, Dulcolax, Ex Lax)    °o Do not take laxatives for more than 7days in a row. °•  IF SEVERELY CONSTIPATED, try a Bowel Retraining Program: °o Do not use laxatives.  °o Eat a diet high in roughage, such as bran cereals and leafy vegetables.  °o Drink six (6) ounces of prune or apricot juice each morning.  °o Eat two (2) large servings of stewed fruit each day.  °o Take one (1) heaping tablespoon of a psyllium-based bulking agent twice a day. Use sugar-free sweetener when possible to avoid excessive calories.  °o Eat a normal breakfast.  °o Set aside 15 minutes after breakfast to sit on the toilet, but do not strain to have a bowel movement.  °o If you do not have a bowel movement by the third day, use an enema and repeat the above steps.  ° ° °

## 2018-10-13 NOTE — Interval H&P Note (Signed)
History and Physical Interval Note:  10/13/2018 1:09 PM  Veronica Cannon  has presented today for surgery, with the diagnosis of Rectal bleeding  The various methods of treatment have been discussed with the patient and family. After consideration of risks, benefits and other options for treatment, the patient has consented to  Procedure(s): COLONOSCOPY (N/A) as a surgical intervention .  The patient's history has been reviewed, patient examined, no change in status, stable for surgery.  I have reviewed the patient's chart and labs.  Questions were answered to the patient's satisfaction.     Vanita PandaAlicia C Sahira Cataldi

## 2018-10-15 ENCOUNTER — Encounter (HOSPITAL_COMMUNITY): Payer: Self-pay | Admitting: General Surgery

## 2020-11-07 ENCOUNTER — Observation Stay (HOSPITAL_COMMUNITY)
Admission: EM | Admit: 2020-11-07 | Discharge: 2020-11-08 | Disposition: A | Discharge status: Home / Self Care | Payer: 59 | Attending: Neurological Surgery | Admitting: Neurological Surgery

## 2020-11-07 ENCOUNTER — Emergency Department (HOSPITAL_COMMUNITY): Payer: 59

## 2020-11-07 ENCOUNTER — Other Ambulatory Visit: Payer: Self-pay

## 2020-11-07 ENCOUNTER — Observation Stay: Admission: AD | Admit: 2020-11-07 | Payer: 59 | Source: Ambulatory Visit | Admitting: Neurological Surgery

## 2020-11-07 ENCOUNTER — Encounter (HOSPITAL_COMMUNITY): Payer: Self-pay | Admitting: Neurological Surgery

## 2020-11-07 DIAGNOSIS — R531 Weakness: Secondary | ICD-10-CM | POA: Diagnosis present

## 2020-11-07 DIAGNOSIS — Z8249 Family history of ischemic heart disease and other diseases of the circulatory system: Secondary | ICD-10-CM

## 2020-11-07 DIAGNOSIS — Z91040 Latex allergy status: Secondary | ICD-10-CM

## 2020-11-07 DIAGNOSIS — R400 Somnolence: Secondary | ICD-10-CM | POA: Diagnosis not present

## 2020-11-07 DIAGNOSIS — R4182 Altered mental status, unspecified: Secondary | ICD-10-CM | POA: Insufficient documentation

## 2020-11-07 DIAGNOSIS — Z888 Allergy status to other drugs, medicaments and biological substances status: Secondary | ICD-10-CM

## 2020-11-07 DIAGNOSIS — Z7984 Long term (current) use of oral hypoglycemic drugs: Secondary | ICD-10-CM

## 2020-11-07 DIAGNOSIS — Z20822 Contact with and (suspected) exposure to covid-19: Secondary | ICD-10-CM | POA: Insufficient documentation

## 2020-11-07 DIAGNOSIS — Z791 Long term (current) use of non-steroidal anti-inflammatories (NSAID): Secondary | ICD-10-CM

## 2020-11-07 DIAGNOSIS — Z9889 Other specified postprocedural states: Secondary | ICD-10-CM

## 2020-11-07 DIAGNOSIS — Z833 Family history of diabetes mellitus: Secondary | ICD-10-CM

## 2020-11-07 LAB — RESP PANEL BY RT-PCR (FLU A&B, COVID) ARPGX2
Influenza A by PCR: NEGATIVE
Influenza B by PCR: NEGATIVE
SARS Coronavirus 2 by RT PCR: NEGATIVE

## 2020-11-07 LAB — COMPREHENSIVE METABOLIC PANEL
ALT: 75 U/L — ABNORMAL HIGH (ref 0–44)
AST: 56 U/L — ABNORMAL HIGH (ref 15–41)
Albumin: 3.6 g/dL (ref 3.5–5.0)
Alkaline Phosphatase: 57 U/L (ref 38–126)
Anion gap: 11 (ref 5–15)
BUN: 6 mg/dL (ref 6–20)
CO2: 24 mmol/L (ref 22–32)
Calcium: 9.1 mg/dL (ref 8.9–10.3)
Chloride: 103 mmol/L (ref 98–111)
Creatinine, Ser: 0.62 mg/dL (ref 0.44–1.00)
GFR, Estimated: >60 mL/min (ref >60)
Glucose, Bld: 223 mg/dL — ABNORMAL HIGH (ref 70–99)
Potassium: 3.9 mmol/L (ref 3.5–5.1)
Sodium: 138 mmol/L (ref 135–145)
Total Bilirubin: 0.8 mg/dL (ref 0.3–1.2)
Total Protein: 7.8 g/dL (ref 6.5–8.1)

## 2020-11-07 LAB — URINALYSIS, ROUTINE W REFLEX MICROSCOPIC
Bilirubin Urine: NEGATIVE
Glucose, UA: 150 mg/dL — AB
Hgb urine dipstick: NEGATIVE
Ketones, ur: NEGATIVE mg/dL
Leukocytes,Ua: NEGATIVE
Nitrite: NEGATIVE
Protein, ur: NEGATIVE mg/dL
Specific Gravity, Urine: 1.006 (ref 1.005–1.030)
pH: 7 (ref 5.0–8.0)

## 2020-11-07 LAB — CBC
HCT: 41.4 % (ref 36.0–46.0)
Hemoglobin: 11.9 g/dL — ABNORMAL LOW (ref 12.0–15.0)
MCH: 19.8 pg — ABNORMAL LOW (ref 26.0–34.0)
MCHC: 28.7 g/dL — ABNORMAL LOW (ref 30.0–36.0)
MCV: 68.8 fL — ABNORMAL LOW (ref 80.0–100.0)
Platelets: 336 10*3/uL (ref 150–400)
RBC: 6.02 MIL/uL — ABNORMAL HIGH (ref 3.87–5.11)
RDW: 15.3 % (ref 11.5–15.5)
WBC: 10.2 10*3/uL (ref 4.0–10.5)
nRBC: 0 % (ref 0.0–0.2)

## 2020-11-07 LAB — RAPID URINE DRUG SCREEN, HOSP PERFORMED
Amphetamines: NOT DETECTED
Barbiturates: NOT DETECTED
Benzodiazepines: POSITIVE — AB
Cocaine: NOT DETECTED
Opiates: NOT DETECTED
Tetrahydrocannabinol: NOT DETECTED

## 2020-11-07 LAB — PROTIME-INR
INR: 1.1 (ref 0.8–1.2)
Prothrombin Time: 13.5 seconds (ref 11.4–15.2)

## 2020-11-07 LAB — APTT: aPTT: 30 seconds (ref 24–36)

## 2020-11-07 LAB — DIFFERENTIAL
Abs Immature Granulocytes: 0.05 10*3/uL (ref 0.00–0.07)
Basophils Absolute: 0 10*3/uL (ref 0.0–0.1)
Basophils Relative: 0 %
Eosinophils Absolute: 0 10*3/uL (ref 0.0–0.5)
Eosinophils Relative: 0 %
Immature Granulocytes: 1 %
Lymphocytes Relative: 12 %
Lymphs Abs: 1.3 10*3/uL (ref 0.7–4.0)
Monocytes Absolute: 0.1 10*3/uL (ref 0.1–1.0)
Monocytes Relative: 1 %
Neutro Abs: 8.8 10*3/uL — ABNORMAL HIGH (ref 1.7–7.7)
Neutrophils Relative %: 86 %

## 2020-11-07 LAB — I-STAT BETA HCG BLOOD, ED (MC, WL, AP ONLY): I-stat hCG, quantitative: <5 m[IU]/mL (ref <5)

## 2020-11-07 LAB — CBG MONITORING, ED: Glucose-Capillary: 195 mg/dL — ABNORMAL HIGH (ref 70–99)

## 2020-11-07 MED ORDER — HYDROCODONE-ACETAMINOPHEN 7.5-325 MG PO TABS
1.0000 | ORAL_TABLET | Freq: Four times a day (QID) | ORAL | Status: DC
  Administered 2020-11-08 (×3): 1 via ORAL
  Filled 2020-11-07 (×3): qty 1

## 2020-11-07 MED ORDER — SODIUM CHLORIDE 0.9% FLUSH
3.0000 mL | Freq: Two times a day (BID) | INTRAVENOUS | Status: DC
  Administered 2020-11-08: 3 mL via INTRAVENOUS

## 2020-11-07 MED ORDER — METHOCARBAMOL 500 MG PO TABS: 500.0000 mg | ORAL_TABLET | Freq: Four times a day (QID) | ORAL | Status: DC | PRN

## 2020-11-07 MED ORDER — SODIUM CHLORIDE 0.9 % IV SOLN: 250.0000 mL | INTRAVENOUS | Status: DC

## 2020-11-07 MED ORDER — IOHEXOL 350 MG/ML SOLN
75.0000 mL | Freq: Once | INTRAVENOUS | Status: AC | PRN
  Administered 2020-11-07: 75 mL via INTRAVENOUS

## 2020-11-07 MED ORDER — ONDANSETRON HCL 4 MG/2ML IJ SOLN: 4.0000 mg | Freq: Four times a day (QID) | INTRAMUSCULAR | Status: DC | PRN

## 2020-11-07 MED ORDER — SODIUM CHLORIDE 0.9% FLUSH: 3.0000 mL | INTRAVENOUS | Status: DC | PRN

## 2020-11-07 MED ORDER — SENNA 8.6 MG PO TABS
1.0000 | ORAL_TABLET | Freq: Two times a day (BID) | ORAL | Status: DC
  Administered 2020-11-07 – 2020-11-08 (×2): 8.6 mg via ORAL
  Filled 2020-11-07 (×2): qty 1

## 2020-11-07 MED ORDER — POTASSIUM CHLORIDE IN NACL 20-0.9 MEQ/L-% IV SOLN
INTRAVENOUS | Status: DC
  Filled 2020-11-07: qty 1000

## 2020-11-07 MED ORDER — MENTHOL 3 MG MT LOZG: 1.0000 | LOZENGE | OROMUCOSAL | Status: DC | PRN

## 2020-11-07 MED ORDER — DEXAMETHASONE 4 MG PO TABS
4.0000 mg | ORAL_TABLET | Freq: Four times a day (QID) | ORAL | Status: DC
  Administered 2020-11-08 (×3): 4 mg via ORAL
  Filled 2020-11-07 (×3): qty 1

## 2020-11-07 MED ORDER — PHENOL 1.4 % MT LIQD: 1.0000 | OROMUCOSAL | Status: DC | PRN

## 2020-11-07 MED ORDER — ACETAMINOPHEN 650 MG RE SUPP: 650.0000 mg | RECTAL | Status: DC | PRN

## 2020-11-07 MED ORDER — MORPHINE SULFATE (PF) 2 MG/ML IV SOLN
2.0000 mg | INTRAVENOUS | Status: DC | PRN
Start: 2020-11-07 — End: 2020-11-08
  Administered 2020-11-07: 2 mg via INTRAVENOUS
  Filled 2020-11-07: qty 1

## 2020-11-07 MED ORDER — ACETAMINOPHEN 325 MG PO TABS: 650.0000 mg | ORAL_TABLET | ORAL | Status: DC | PRN

## 2020-11-07 MED ORDER — ONDANSETRON HCL 4 MG PO TABS: 4.0000 mg | ORAL_TABLET | Freq: Four times a day (QID) | ORAL | Status: DC | PRN

## 2020-11-07 MED ORDER — METHOCARBAMOL 1000 MG/10ML IJ SOLN: 500.0000 mg | Freq: Four times a day (QID) | INTRAVENOUS | Status: DC | PRN

## 2020-11-07 MED ORDER — CELECOXIB 200 MG PO CAPS
200.0000 mg | ORAL_CAPSULE | Freq: Two times a day (BID) | ORAL | Status: DC
  Administered 2020-11-07 – 2020-11-08 (×2): 200 mg via ORAL
  Filled 2020-11-07 (×2): qty 1

## 2020-11-07 MED ORDER — DEXAMETHASONE SODIUM PHOSPHATE 4 MG/ML IJ SOLN: 4.0000 mg | Freq: Four times a day (QID) | INTRAMUSCULAR | Status: DC

## 2020-11-07 NOTE — ED Notes (Signed)
Pt in MRI, unable to obtain vitals. 

## 2020-11-07 NOTE — ED Provider Notes (Signed)
[facility] EMERGENCY DEPARTMENT Provider Note   CSN: [id number] Arrival date & time: 11/07/20  1617     History Chief Complaint  Patient presents with  . Code Stroke    Veronica Cannon is a 35 y.o. female.  She was at the surgical center today for a neurosurgery procedure.  She follows with Dr. Yetta Barre and has had 4 months history of left leg pain.  No reported weakness per his H&P.  She had a left L4-L5 microdiscectomy this morning.  Apparently on coming out of anesthesia she complained of pain and weakness in her left leg and foot.  They observe this for a while with no improvement.  They took her back to the OR to explore the area and did not find an obvious cause of her symptoms.  She seems to also have weakness of her left arm.  Her exam is very difficult as she is very somnolent from medication.  Level 5 caveat secondary to altered mental status  The history is provided by the EMS personnel.  Weakness Onset quality:  Unable to specify Timing:  Constant Progression:  Unchanged Chronicity:  New Relieved by:  None tried Worsened by:  Nothing Ineffective treatments:  None tried      Past Medical History:  Diagnosis Date  . Gestational diabetes mellitus, antepartum   . Pre-eclampsia, severe, delivered 01/30/2014    Patient Active Problem List   Diagnosis Date Noted  . Hemorrhoids, internal, with bleeding & occ prolapse 03/20/2014  . Constipation, chronic 03/20/2014    Past Surgical History:  Procedure Laterality Date  . CESAREAN SECTION    . CESAREAN SECTION N/A 01/29/2014   Procedure: Repeat Cesarean Section Delivery Baby Girl  @ 2112,  Apgars 8/9;  Surgeon: Essie Hart, MD;  Location: [facility];  Service: Obstetrics;  Laterality: N/A;  . COLONOSCOPY N/A 10/13/2018   Procedure: COLONOSCOPY;  Surgeon: Romie Levee, MD;  Location: [facility];  Service: Endoscopy;  Laterality: N/A;     OB History    Gravida  2   Para  2   Term  1   Preterm  1    AB      Living  2     SAB      IAB      Ectopic      Multiple      Live Births  1           Family History  Problem Relation Age of Onset  . Cancer Other   . Diabetes Other   . Hypertension Other   . Heart disease Other     Social History   Tobacco Use  . Smoking status: Never Smoker  . Smokeless tobacco: Never Used  Substance Use Topics  . Alcohol use: No  . Drug use: No    Home Medications Prior to Admission medications   Medication Sig Start Date End Date Taking? Authorizing Provider  metFORMIN (GLUCOPHAGE) 500 MG tablet Take 500 mg by mouth daily.    [provider]  naproxen (NAPROSYN) 500 MG tablet Take 1 tablet (500 mg total) by mouth 2 (two) times daily. 10/12/14   Dione Booze, MD    Allergies    Latex  Review of Systems   Review of Systems  Unable to perform ROS: Mental status change  Neurological: Positive for weakness.    Physical Exam Updated Vital Signs BP (!) 147/91 (BP Location: Right Arm)   Pulse (!) 105   Temp 98.9  F (37.2 C) (Oral)   Resp 16   SpO2 92%   Physical Exam Vitals and nursing note reviewed.  Constitutional:      General: She is not in acute distress.    Appearance: Normal appearance. She is well-developed and well-nourished.  HENT:     Head: Normocephalic and atraumatic.     Mouth/Throat:     Mouth: Oropharynx is clear and moist.  Eyes:     Conjunctiva/sclera: Conjunctivae normal.  Cardiovascular:     Rate and Rhythm: Normal rate and regular rhythm.     Pulses: Intact distal pulses.     Heart sounds: No murmur heard.   Pulmonary:     Effort: Pulmonary effort is normal. No respiratory distress.     Breath sounds: Normal breath sounds. No stridor. No wheezing.  Abdominal:     Palpations: Abdomen is soft.     Tenderness: There is no abdominal tenderness.  Musculoskeletal:        General: No tenderness or edema. Normal range of motion.     Cervical back: Neck supple.  Skin:    General:  Skin is warm and dry.  Neurological:     Mental Status: She is lethargic.     GCS: GCS eye subscore is 4. GCS verbal subscore is 5. GCS motor subscore is 6.     Comments: Her neuro exam is very difficult due to her somnolence and participation.  She will move her right arm and right leg just barely over gravity.  Her left leg she will not move at all.  But she does recognize proprioception and identifies her big toe.  Her left arm she will give minimal grasp.  Not able to lift against gravity.  Psychiatric:        Mood and Affect: Mood and affect normal.     ED Results / Procedures / Treatments   Labs (all labs ordered are listed, but only abnormal results are displayed) Labs Reviewed  CBC - Abnormal; Notable for the following components:      Result Value   RBC 6.02 (*)    Hemoglobin 11.9 (*)    MCV 68.8 (*)    MCH 19.8 (*)    MCHC 28.7 (*)    All other components within normal limits  DIFFERENTIAL - Abnormal; Notable for the following components:   Neutro Abs 8.8 (*)    All other components within normal limits  COMPREHENSIVE METABOLIC PANEL - Abnormal; Notable for the following components:   Glucose, Bld 223 (*)    AST 56 (*)    ALT 75 (*)    All other components within normal limits  RAPID URINE DRUG SCREEN, HOSP PERFORMED - Abnormal; Notable for the following components:   Benzodiazepines POSITIVE (*)    All other components within normal limits  URINALYSIS, ROUTINE W REFLEX MICROSCOPIC - Abnormal; Notable for the following components:   Color, Urine STRAW (*)    Glucose, UA 150 (*)    All other components within normal limits  CBG MONITORING, ED - Abnormal; Notable for the following components:   Glucose-Capillary 195 (*)    All other components within normal limits  RESP PANEL BY RT-PCR (FLU A&B, COVID) ARPGX2  PROTIME-INR  APTT  I-STAT CHEM 8, ED  I-STAT BETA HCG BLOOD, ED ([facility], [facility], [facility] ONLY)    EKG EKG Interpretation  Date/Time:  Wednesday November 07 2020  16:26:10 EST Ventricular Rate:  102 PR Interval:    QRS Duration: 93 QT Interval:  355 QTC Calculation: 463 R Axis:   57 Text Interpretation: Sinus tachycardia increased rate from prior 12/15 Confirmed by Meridee Score [phone]) on 11/07/2020 4:43:36 PM   Radiology MR BRAIN WO CONTRAST  Result Date: 11/07/2020 CLINICAL DATA:  Initial evaluation for acute neuro deficit, stroke suspected. EXAM: MRI HEAD WITHOUT CONTRAST TECHNIQUE: Multiplanar, multiecho pulse sequences of the brain and surrounding structures were obtained without intravenous contrast. COMPARISON:  Comparison made with prior CTA and CT from earlier the same day. FINDINGS: Brain: Cerebral volume within normal limits for age. Few scattered subcentimeter foci of T2/FLAIR hyperintensity noted involving the periventricular, deep, and subcortical white matter both cerebral hemispheres, nonspecific, but overall mild in nature. No abnormal foci of restricted diffusion to suggest acute or subacute ischemia. Gray-white matter differentiation maintained. No encephalomalacia to suggest chronic cortical infarction. No foci of susceptibility artifact to suggest acute or chronic intracranial hemorrhage. 7 mm calcified meningioma overlies the left frontal convexity (series 5, image 15). No associated edema or mass effect. No other mass lesion. No midline shift or hydrocephalus. No extra-axial fluid collection. Pituitary gland suprasellar region within normal limits. Midline structures intact. Vascular: Major intracranial vascular flow voids are maintained. Skull and upper cervical spine: Craniocervical junction within normal limits. Upper cervical spine normal. Bone marrow signal intensity within normal limits. No scalp soft tissue abnormality. Sinuses/Orbits: Globes and orbital soft tissues within normal limits. Paranasal sinuses are largely clear. No significant mastoid effusion. Inner ear structures normal. Other: None. IMPRESSION: 1. No acute  intracranial abnormality. 2. Scattered subcentimeter foci of T2/FLAIR hyperintensity involving the supratentorial cerebral white matter, nonspecific, but overall mild for age. Differential considerations are broad, and include changes of chronic small-vessel ischemia, sequelae of prior trauma, hypercoagulable state, vasculitis, sequelae of complicated migraines, prior infectious or inflammatory process, or demyelination. 3. 7 mm calcified meningioma overlying the left frontal convexity without associated mass effect. Electronically Signed   By: Rise Mu M.D.   On: 11/07/2020 19:59   MR LUMBAR SPINE WO CONTRAST  Result Date: 11/07/2020 CLINICAL DATA:  Low back pain, progressive neurological deficit. EXAM: MRI LUMBAR SPINE WITHOUT CONTRAST TECHNIQUE: Multiplanar, multisequence MR imaging of the lumbar spine was performed. No intravenous contrast was administered. COMPARISON:  MRI of the lumbar spine October 04, 2020. FINDINGS: Segmentation:  Standard. Alignment:  Physiologic. Vertebrae: No acute fracture, evidence of discitis, or bone lesion. Bilateral L5 spondylolisthesis without anterolisthesis. Postsurgical changes from left laminectomy and micro discectomy. Conus medullaris and cauda equina: Conus extends to the T12-L1 level. Conus and cauda equina appear normal. Paraspinal and other soft tissues: Expected postsurgical changes in the paraspinal musculature from left L4-5 laminectomy. Disc levels: T12-L1: Small posterior disc protrusion without significant spinal canal or neural foraminal stenosis. L1-2: No spinal canal or neural foraminal stenosis. L2-3: No spinal canal or neural foraminal stenosis. L3-4: No spinal canal or neural foraminal stenosis. L4-5: Postsurgical changes from recent left laminectomy and micro discectomy with T2 hyperintense fluid within the disc/ left subarticular zone and left posterolateral T2 hypointensity likely reflecting serosanguineous collection which extends  inferiorly to the L5-S1 level. Small residual central disc extrusion is also seen. Findings result in mild-to-moderate narrowing of the thecal sac and left subarticular zone. No neural foraminal narrowing. L5-S1: Mild facet degenerative changes. No spinal canal or neural foraminal stenosis. Left posterolateral inferior extension of the L4-5 fluid collection causes mild mass effect on the thecal sac at this level. IMPRESSION: Expected postsurgical changes from recent left laminectomy and micro discectomy at L4-L5. Left subarticular zone  and left posterolateral collection at this level, extending inferiorly to the L5-S1 level, causing mild to moderate mass effect on the thecal sac. Electronically Signed   By: Pedro Earls M.D.   On: 11/07/2020 20:06   CT HEAD CODE STROKE WO CONTRAST  Result Date: 11/07/2020 CLINICAL DATA:  Code stroke. Acute neuro deficit. Left-sided weakness EXAM: CT HEAD WITHOUT CONTRAST TECHNIQUE: Contiguous axial images were obtained from the base of the skull through the vertex without intravenous contrast. COMPARISON:  None. FINDINGS: Brain: No evidence of acute infarction, hemorrhage, hydrocephalus, extra-axial collection or mass lesion/mass effect. Vascular: Negative for hyperdense vessel Skull: Negative Sinuses/Orbits: Small air-fluid level left sphenoid sinus. Mild mucosal edema in the maxillary sinus bilaterally Other: None ASPECTS ([place] Stroke Program Early CT Score) - Ganglionic level infarction (caudate, lentiform nuclei, internal capsule, insula, M1-M3 cortex): 7 - Supraganglionic infarction (M4-M6 cortex): 3 Total score (0-10 with 10 being normal): 10 IMPRESSION: 1. Negative CT of the brain 2. ASPECTS is 10 3. Small air-fluid level sphenoid sinus and mucosal edema in the maxillary sinus bilaterally. 4. Code stroke imaging results were communicated on 11/07/2020 at 5:22 pm to provider Lorrin Goodell via text page Electronically Signed   By: Franchot Gallo M.D.   On:  11/07/2020 17:23   CT ANGIO HEAD CODE STROKE  Result Date: 11/07/2020 CLINICAL DATA:  Stroke/TIA, assess extracranial arteries. Stroke/TIA, assess intracranial arteries. Left-sided weakness. EXAM: CT ANGIOGRAPHY HEAD AND NECK TECHNIQUE: Multidetector CT imaging of the head and neck was performed using the standard protocol during bolus administration of intravenous contrast. Multiplanar CT image reconstructions and MIPs were obtained to evaluate the vascular anatomy. Carotid stenosis measurements (when applicable) are obtained utilizing NASCET criteria, using the distal internal carotid diameter as the denominator. CONTRAST:  53mL OMNIPAQUE IOHEXOL 350 MG/ML SOLN COMPARISON:  Noncontrast head CT 11/07/2020. FINDINGS: CTA NECK FINDINGS Aortic arch: Standard aortic branching. The visualized aortic arch is unremarkable. No hemodynamically significant innominate or proximal subclavian artery stenosis. Right carotid system: CCA and ICA patent within the neck without stenosis. No significant atherosclerotic disease. Left carotid system: CCA and ICA patent within the neck without stenosis. No significant atherosclerotic disease. Vertebral arteries: Codominant and patent within the neck without stenosis. Skeleton: No acute bony abnormality or aggressive osseous lesion. Cervical levocurvature with partially imaged thoracic dextrocurvature. Other neck: Subcentimeter thyroid nodules not meeting consensus criteria for ultrasound follow-up. Nonspecific mildly enlarged left level 2 lymph node measuring 12 mm in short axis (series 7, image 160). Upper chest: Patchy and linear opacities within the imaged right lung apex may reflect atelectasis or pneumonia. Review of the MIP images confirms the above findings CTA HEAD FINDINGS Anterior circulation: The intracranial internal carotid arteries are patent. The M1 middle cerebral arteries are patent. No M2 proximal branch occlusion or high-grade proximal stenosis is identified. The  anterior cerebral arteries are patent. No intracranial aneurysm is identified. Posterior circulation: The intracranial vertebral arteries are patent. The basilar artery is patent. The posterior cerebral arteries are patent. Posterior communicating arteries are present bilaterally. Venous sinuses: Within the limitations of contrast timing, no convincing thrombus. Anatomic variants: None significant Other: 7 mm partially calcified dural-based focus overlying the mid left frontal lobe likely reflecting an incidental tiny meningioma. Review of the MIP images confirms the above findings These results were communicated to Dr. Lorrin Goodell At 5:49 pmon 1/5/2022by text page via the [facility] messaging system. IMPRESSION: CTA neck: 1. The common carotid, internal carotid and vertebral arteries are patent within the neck without hemodynamically  significant stenosis. 2. Patchy and linear opacities within the imaged right lung apex, which may reflect atelectasis or pneumonia. 3. Nonspecific mildly enlarged left level 2 lymph node. Clinical correlation recommended. CTA head: 1. No intracranial large vessel occlusion or proximal high-grade arterial stenosis. 2. 7 mm probable incidental meningioma overlying the mid left frontal lobe. Electronically Signed   By: Jackey Loge DO   On: 11/07/2020 17:52   CT ANGIO NECK CODE STROKE  Result Date: 11/07/2020 CLINICAL DATA:  Stroke/TIA, assess extracranial arteries. Stroke/TIA, assess intracranial arteries. Left-sided weakness. EXAM: CT ANGIOGRAPHY HEAD AND NECK TECHNIQUE: Multidetector CT imaging of the head and neck was performed using the standard protocol during bolus administration of intravenous contrast. Multiplanar CT image reconstructions and MIPs were obtained to evaluate the vascular anatomy. Carotid stenosis measurements (when applicable) are obtained utilizing NASCET criteria, using the distal internal carotid diameter as the denominator. CONTRAST:  57mL OMNIPAQUE IOHEXOL 350  MG/ML SOLN COMPARISON:  Noncontrast head CT 11/07/2020. FINDINGS: CTA NECK FINDINGS Aortic arch: Standard aortic branching. The visualized aortic arch is unremarkable. No hemodynamically significant innominate or proximal subclavian artery stenosis. Right carotid system: CCA and ICA patent within the neck without stenosis. No significant atherosclerotic disease. Left carotid system: CCA and ICA patent within the neck without stenosis. No significant atherosclerotic disease. Vertebral arteries: Codominant and patent within the neck without stenosis. Skeleton: No acute bony abnormality or aggressive osseous lesion. Cervical levocurvature with partially imaged thoracic dextrocurvature. Other neck: Subcentimeter thyroid nodules not meeting consensus criteria for ultrasound follow-up. Nonspecific mildly enlarged left level 2 lymph node measuring 12 mm in short axis (series 7, image 160). Upper chest: Patchy and linear opacities within the imaged right lung apex may reflect atelectasis or pneumonia. Review of the MIP images confirms the above findings CTA HEAD FINDINGS Anterior circulation: The intracranial internal carotid arteries are patent. The M1 middle cerebral arteries are patent. No M2 proximal branch occlusion or high-grade proximal stenosis is identified. The anterior cerebral arteries are patent. No intracranial aneurysm is identified. Posterior circulation: The intracranial vertebral arteries are patent. The basilar artery is patent. The posterior cerebral arteries are patent. Posterior communicating arteries are present bilaterally. Venous sinuses: Within the limitations of contrast timing, no convincing thrombus. Anatomic variants: None significant Other: 7 mm partially calcified dural-based focus overlying the mid left frontal lobe likely reflecting an incidental tiny meningioma. Review of the MIP images confirms the above findings These results were communicated to Dr. Derry Lory At 5:49 pmon 1/5/2022by  text page via the [facility] messaging system. IMPRESSION: CTA neck: 1. The common carotid, internal carotid and vertebral arteries are patent within the neck without hemodynamically significant stenosis. 2. Patchy and linear opacities within the imaged right lung apex, which may reflect atelectasis or pneumonia. 3. Nonspecific mildly enlarged left level 2 lymph node. Clinical correlation recommended. CTA head: 1. No intracranial large vessel occlusion or proximal high-grade arterial stenosis. 2. 7 mm probable incidental meningioma overlying the mid left frontal lobe. Electronically Signed   By: Jackey Loge DO   On: 11/07/2020 17:52    Procedures Procedures (including critical care time)  Medications Ordered in ED Medications  iohexol (OMNIPAQUE) 350 MG/ML injection 75 mL (75 mLs Intravenous Contrast Given 11/07/20 1721)    ED Course  I have reviewed the triage vital signs and the nursing notes.  Pertinent labs & imaging results that were available during my care of the patient were reviewed by me and considered in my medical decision making (see chart for details).  Clinical Course as of 11/07/20 2222  Wed Nov 07, 2020  1656 Reassessed-patient a little more alert.  She denies any pain in her head or neck.  She says she has a little bit of pain in her back.  Has urinated here through the pure wick.  She will raise her right arm and wiggle her right foot.  Do not see really any movement of her left leg foot.  Slight movement of left hand. [MB]  1705 Discussed with neurology Dr. Melina Fiddler who recommends activating a code stroke.  Clerk informed and CT change to code stroke. [MB]  1757 Neurosurgery Dr. Yetta Barre called me.  He said her exam was very inconsistent on coming out of anesthesia but was primarily just left leg.  He said when she was not being examined he observed her moving the leg more spontaneously.  He is surprised to hear that she is now having problems with her left arm and some problems with  her right arm and leg.  He said he ordered an MRI of her head and her lumbar spine.  Asks that we call him back after MRI imaging and he will admit the patient [MB]  2134 Discussed with Dr. Franky Macho covering for Dr. Yetta Barre.  He said to admit the patient to Dr. Yetta Barre' service. [MB]    Clinical Course User Index [MB] Terrilee Files, MD   MDM Rules/Calculators/A&P                         This patient complains of lethargy and inability to move left side of body; this involves an extensive number of treatment Options and is a complaint that carries with it a high risk of complications and Morbidity. The differential includes stroke, bleed, dissection, anesthesia reaction, hypoglycemia, conversion disorder  I ordered, reviewed and interpreted labs, which included CBC with normal white count, stable hemoglobin, normal platelets, chemistries fairly normal other than elevated glucose and mild elevation of AST and ALT, urinalysis without signs of infection, coags normal  I ordered imaging studies which included CT head, CT angio head and neck, MRI brain and MRI lumbar and I independently    visualized and interpreted imaging which showed no acute findings other than postsurgical changes Additional history obtained from patient's neurosurgeon Dr. Yetta Barre Previous records obtained and reviewed in epic including surgery today I consulted Dr. Derry Lory neurology and Dr. Franky Macho neurosurgery and discussed lab and imaging findings  Critical Interventions: None  After the interventions stated above, I reevaluated the patient and found patient still to not be able to move the left arm or leg.  No structural reason identified.  Neurosurgery to admit the patient to their service.   Final Clinical Impression(s) / ED Diagnoses Final diagnoses:  Left-sided weakness  Somnolence    Rx / DC Orders ED Discharge Orders    None       Terrilee Files, MD 11/08/20 1054

## 2020-11-07 NOTE — ED Notes (Signed)
Pt transferred to MRI

## 2020-11-07 NOTE — Progress Notes (Signed)
Patient arrived in the unit at 2130, alert and oriented x4, initiated telemetry monitoring, verbalized of feeling mild  pain on lower back, will provide appropriate treatments and continue to monitor closely.

## 2020-11-07 NOTE — ED Triage Notes (Addendum)
Pt arrives via EMS from surgical center, had microdiscectomy of L4-L5  this morning at 0800. Numbness and weakness to L arm and L leg started during her recovery and progressively worsening. Pt was then taken back to the OR but no cause was found, so she was brought to PACU and EMS was called. Pt arrives very lethargic from medications and unable to move L leg, reflexes absent. Movement to L arm, but minimal.

## 2020-11-07 NOTE — Consult Note (Addendum)
NEUROLOGY CONSULTATION NOTE   Date of service: November 07, 2020 Patient Name: Veronica Cannon MRN:  [id number] DOB:  04-28-1986 Reason for consult: "Stroke code" _ _ _   _ __   _ __ _ _  __ __   _ __   __ _  History of Present Illness  Veronica Cannon is a 35 y.o. female with PMH significant for gestational diabetes, preeclampsia, 49-month history of leg pain who presents with left arm and left leg weakness after L4-L5 microdiscectomy.  Unclear last known well, patient underwent microdiscectomy of L4-L5 today it was noted to be weak in her left leg, left arm and reported some left leg pain.  She was taken back to the OR and the wound was examined with no concern.  EMS was called and she was brought in to the ED.  On my evaluation, she has some effort dependent weakness in her left arm and left leg.  No aphasia.  No right arm weakness.  No right leg weakness.  No obvious facial droop.  No vision deficit on the left on the right. Slowed responses and requires a lot of encuragement to do anything but no aphasia.  NIHSS components Score: Comment  1a Level of Conscious 0[x]  1[]  2[]  3[]      1b LOC Questions 0[x]  1[]  2[]       1c LOC Commands 0[x]  1[]  2[]       2 Best Gaze 0[x]  1[]  2[]       3 Visual 0[x]  1[]  2[]  3[]      4 Facial Palsy 0[x]  1[]  2[]  3[]      5a Motor Arm - left 0[]  1[]  2[]  3[]  4[x]  UN[]    5b Motor Arm - Right 0[x]  1[]  2[]  3[]  4[]  UN[]    6a Motor Leg - Left 0[]  1[]  2[]  3[x]  4[]  UN[]    6b Motor Leg - Right 0[x]  1[]  2[]  3[]  4[]  UN[]    7 Limb Ataxia 0[x]  1[]  2[]  3[]  UN[]     8 Sensory 0[x]  1[]  2[]  UN[]      9 Best Language 0[x]  1[]  2[]  3[]      10 Dysarthria 0[x]  1[]  2[]  UN[]      11 Extinct. and Inattention 0[x]  1[]  2[]       TOTAL: 7        ROS   Unable to obtain due to somnolence.  Endorses some back pain.  Past History   Past Medical History:  Diagnosis Date  . Gestational diabetes mellitus, antepartum   . Pre-eclampsia, severe, delivered 01/30/2014   Past Surgical  History:  Procedure Laterality Date  . CESAREAN SECTION    . CESAREAN SECTION N/A 01/29/2014   Procedure: Repeat Cesarean Section Delivery Baby Girl  @ 2112,  Apgars 8/9;  Surgeon: Essie Hart, MD;  Location: [facility];  Service: Obstetrics;  Laterality: N/A;  . COLONOSCOPY N/A 10/13/2018   Procedure: COLONOSCOPY;  Surgeon: Romie Levee, MD;  Location: [facility];  Service: Endoscopy;  Laterality: N/A;   Family History  Problem Relation Age of Onset  . Cancer Other   . Diabetes Other   . Hypertension Other   . Heart disease Other    Social History   Socioeconomic History  . Marital status: Single    Spouse name: Not on file  . Number of children: Not on file  . Years of education: Not on file  . Highest education level: Not on file  Occupational History  . Not on file  Tobacco Use  . Smoking status: Never Smoker  . Smokeless  tobacco: Never Used  Substance and Sexual Activity  . Alcohol use: No  . Drug use: No  . Sexual activity: Yes  Other Topics Concern  . Not on file  Social History Narrative  . Not on file   Social Determinants of Health   Financial Resource Strain: Not on file  Food Insecurity: Not on file  Transportation Needs: Not on file  Physical Activity: Not on file  Stress: Not on file  Social Connections: Not on file   Allergies  Allergen Reactions  . Latex Rash    Medications  (Not in a hospital admission)    Vitals   Vitals:   11/07/20 1630 11/07/20 1645 11/07/20 1700 11/07/20 1745  BP: (!) 137/92  (!) 144/104 (!) 151/105  Pulse: (!) 104 (!) 108 (!) 107 (!) 107  Resp: (!) 23 18 18  (!) 24  Temp:      TempSrc:      SpO2: 94% 98% 99% 97%     There is no height or weight on file to calculate BMI.  Physical Exam   General: Laying comfortably in bed; in no acute distress. HENT: Normal oropharynx and mucosa. Normal external appearance of ears and nose. Neck: Supple, no pain or tenderness CV: No JVD. No peripheral edema. Pulmonary:  Symmetric Chest rise. Normal respiratory effort. Abdomen: Soft to touch, non-tender. Ext: No cyanosis, edema, or deformity Skin: No rash. Normal palpation of skin.  Musculoskeletal: Normal digits and nails by inspection. No clubbing.  Neurologic Examination  Mental status/Cognition: Alert, oriented to self, place, did not answer anymore orientation questions. poor attention. Speech/language: Fluent, comprehension intact. Cranial nerves:   CN II Pupils equal and reactive to light, no VF deficits   CN III,IV,VI EOM intact, no gaze preference or deviation, no nystagmus   CN V normal sensation in V1, V2, and V3 segments bilaterally   CN VII no asymmetry, no nasolabial fold flattening   CN VIII normal hearing to speech   CN IX & X normal palatal elevation, no uvular deviation   CN XI 5/5 head turn and 5/5 shoulder shrug bilaterally   CN XII midline tongue protrusion   Motor:  Muscle bulk: normal, tone normal, pronator drift none tremor none Mvmt Root Nerve  Muscle Right Left Comments  SA C5/6 Ax Deltoid 5 0   EF C5/6 Mc Biceps 5 0   EE C6/7/8 Rad Triceps 5 0   WF C6/7 Med FCR 5 0   WE C7/8 PIN ECU 5 0   F Ab C8/T1 U ADM/FDI 5 0   HF L1/2/3 Fem Illopsoas 5 0 Hovers positive.  KE L2/3/4 Fem Quad 5 0   DF L4/5 D Peron Tib Ant 5 0   PF S1/2 Tibial Grc/Sol 5 0    Reflexes:  Right Left Comments  Pectoralis      Biceps (C5/6) 1 1   Brachioradialis (C5/6) 1 1    Triceps (C6/7) 1 1    Patellar (L3/4) 1 1    Achilles (S1)      Hoffman      Plantar     Jaw jerk    Sensation:  Light touch Intact throughout   Pin prick Intact throughout   Temperature    Vibration   Proprioception    Coordination/Complex Motor:  - Finger to Nose unable to get her to do it but no obvious ataxia. - Rapid alternating movement are slowed - Gait: unable to assess given the extent of the weakness.  Labs  CBC:  Recent Labs  Lab 11/07/20 1634  WBC 10.2  HGB 11.9*  HCT 41.4  MCV 68.8*  PLT 336     Basic Metabolic Panel:  Lab Results  Component Value Date   NA 142 10/11/2014   K 4.0 10/11/2014   CO2 26 10/11/2014   GLUCOSE 97 10/11/2014   BUN 14 10/11/2014   CREATININE 0.80 10/11/2014   CALCIUM 9.4 10/11/2014   GFRNONAA >90 10/11/2014   GFRAA >90 10/11/2014   Lipid Panel: No results found for: LDLCALC HgbA1c: No results found for: HGBA1C Urine Drug Screen: No results found for: LABOPIA, COCAINSCRNUR, LABBENZ, AMPHETMU, THCU, LABBARB  Alcohol Level No results found for: ETH  CT Head without contrast: CTH was negative for a large hypodensity concerning for a large territory infarct or hyperdensity concerning for an ICH  CT angio Head and Neck with contrast: No LVO.  MRI Brain pending.  Impression   Veronica Cannon is a 35 y.o. female presenting with left arm and left leg weakness noted in the postop period after L4-L5 microdiscectomy.  Exam with significant effort dependent component, [place] indifference, positive Hoover sign.  Exam most likely points to a functional cause of her symptoms.  She does not have any sensory loss to touch or to pinprick with no clear sensory level which I would typically expect if there was an acute cord compression.  Stroke work-up with a negative CT head without contrast and no LVO on CT angiogram.  She is not a candidate for TPA due to recent surgery and not a candidate for thrombectomy due to no LVO.  However I would like to add that my suspicion for stroke is low.  Recommendations  -Recommend MRI brain without contrast and MR C-spine without contrast if her symptoms persist. -minimize opiods or sedating medications until mentation improves. ______________________________________________________________________   Thank you for the opportunity to take part in the care of this patient. If you have any further questions, please contact the neurology consultation attending.  Signed,  Erick Blinks [facility] Pager  Number [phone] _ _ _   _ __   _ __ _ _  __ __   _ __   __ _

## 2020-11-07 NOTE — Code Documentation (Signed)
Stroke Response Nurse Documentation Code Documentation  Veronica Cannon is a 35 y.o. female arriving to [facility] ED via [facility] on 11/08/2019 She had back surgery earlier today L4-L5. Code stroke was activated by ED. Patient from surgery center where she was LKW at 0800 and now complaining of Left arm and Leg weakness . On No antithrombotic. Stroke team at the bedside on patient arrival. Labs drawn and patient cleared for CT by EDP. Patient to CT with team. NIHSS 9 , see documentation for details and code stroke times. Patient with decreased LOC, left arm weakness, left leg weakness, left decreased sensation and dysarthria  on exam. The following imaging was completed:  CT. Patient is not a candidate for tPA due to LKW at 0800 Care/Plan. Bedside handoff with ED RN Veronica Cannon.  Plan neuro checks and VS Q2 hr and MRI  Veronica Cannon  Stroke Response RN

## 2020-11-08 DIAGNOSIS — Z9889 Other specified postprocedural states: Secondary | ICD-10-CM | POA: Diagnosis not present

## 2020-11-08 DIAGNOSIS — R400 Somnolence: Secondary | ICD-10-CM | POA: Diagnosis not present

## 2020-11-08 DIAGNOSIS — R531 Weakness: Secondary | ICD-10-CM | POA: Diagnosis not present

## 2020-11-08 LAB — GLUCOSE, CAPILLARY: Glucose-Capillary: 168 mg/dL — ABNORMAL HIGH (ref 70–99)

## 2020-11-08 MED ORDER — OXYCODONE-ACETAMINOPHEN 5-325 MG PO TABS: 1.0000 | ORAL_TABLET | ORAL | Status: DC | PRN

## 2020-11-08 MED ORDER — DIPHENHYDRAMINE HCL 25 MG PO CAPS
25.0000 mg | ORAL_CAPSULE | Freq: Four times a day (QID) | ORAL | Status: DC | PRN
  Administered 2020-11-08: 25 mg via ORAL
  Filled 2020-11-08: qty 1

## 2020-11-08 NOTE — Evaluation (Signed)
Physical Therapy Evaluation Patient Details Name: Veronica Cannon MRN: [id number] DOB: 11/03/1986 Today's Date: 11/08/2020   History of Present Illness  35 y.o. female with PMH significant for gestational diabetes, preeclampsia, 75-month history of leg pain who presents with left arm and left leg weakness after L4-L5 microdiscectomy. She was taken back to the OR and the wound was examined with no concern on 11/07/2020. Pt brought to ED after on the same date.  Clinical Impression  Pt presents to PT with deficits in strength, sensation, functional mobility, gait, and power. Pt demonstrates L sided weakness with formal manual muscle testing, less weakness observed with functional tasks and mobility. Pt is able to transfer and ambulate household distances without physical assistance. Pt does have a flight of stairs to ascend to reach her bedroom and will benefit from stair training at next session. Pt will benefit from aggressive mobilization with use of RW to improve strength and confidence in mobility. PT recommends discharge home with outpatient PT and a RW, assistance from spouse as needed.    Follow Up Recommendations Outpatient PT;Supervision for mobility/OOB    Equipment Recommendations  Rolling walker with 5" wheels    Recommendations for Other Services       Precautions / Restrictions Precautions Precautions: Fall;Back Restrictions Weight Bearing Restrictions: No      Mobility  Bed Mobility Overal bed mobility: Needs Assistance Bed Mobility: Rolling;Sidelying to Sit Rolling: Supervision Sidelying to sit: Supervision            Transfers Overall transfer level: Needs assistance Equipment used: Rolling walker (2 wheeled) Transfers: Sit to/from Stand Sit to Stand: Supervision;Min guard         General transfer comment: minG initial transfer progressing to supervision, cues for hand placement  Ambulation/Gait Ambulation/Gait assistance: Min guard;Supervision (minG  progressing to supervision) Gait Distance (Feet): 40 Feet Assistive device: Rolling walker (2 wheeled) Gait Pattern/deviations: Step-to pattern Gait velocity: reduced Gait velocity interpretation: <1.8 ft/sec, indicate of risk for recurrent falls General Gait Details: pt with slowed step-to gait, no buckling or foot drag noted on left, some trembling of UEs on RW but no LOB observed  Stairs Stairs:  (PT provides education on stair negotiation technique, pt declines attempting stairs at this time due to fatigue)          Wheelchair Mobility    Modified Rankin (Stroke Patients Only)       Balance Overall balance assessment: Needs assistance Sitting-balance support: No upper extremity supported;Feet supported Sitting balance-Leahy Scale: Good     Standing balance support: Single extremity supported;Bilateral upper extremity supported Standing balance-Leahy Scale: Poor Standing balance comment: reliant on UE support of RW                             Pertinent Vitals/Pain Pain Assessment: Faces Faces Pain Scale: Hurts a little bit Pain Location: back Pain Descriptors / Indicators: Aching Pain Intervention(s): Monitored during session    Home Living Family/patient expects to be discharged to:: Private residence Living Arrangements: Spouse/significant other;Children Available Help at Discharge: Family;Available 24 hours/day (initially) Type of Home: House Home Access: Level entry     Home Layout: Two level Home Equipment: None      Prior Function Level of Independence: Independent               Hand Dominance        Extremity/Trunk Assessment   Upper Extremity Assessment Upper Extremity Assessment: LUE deficits/detail LUE Deficits /  Details: 4-/5 grossly with formal assessment    Lower Extremity Assessment Lower Extremity Assessment: LLE deficits/detail LLE Deficits / Details: 4/5 knee extension and flexion, 4-/5 PF, 3/5 DF LLE  Sensation: WNL    Cervical / Trunk Assessment Cervical / Trunk Assessment: Normal  Communication   Communication: No difficulties (soft spoken)  Cognition Arousal/Alertness: Awake/alert Behavior During Therapy: WFL for tasks assessed/performed Overall Cognitive Status: Within Functional Limits for tasks assessed                                        General Comments General comments (skin integrity, edema, etc.): VSS on RA    Exercises     Assessment/Plan    PT Assessment Patient needs continued PT services  PT Problem List Decreased strength;Decreased activity tolerance;Decreased balance;Decreased mobility       PT Treatment Interventions DME instruction;Gait training;Stair training;Functional mobility training;Balance training;Neuromuscular re-education;Patient/family education;Therapeutic exercise;Therapeutic activities    PT Goals (Current goals can be found in the Care Plan section)  Acute Rehab PT Goals Patient Stated Goal: to return to independent mobility PT Goal Formulation: With patient Time For Goal Achievement: 11/22/20 Potential to Achieve Goals: Good    Frequency Min 5X/week   Barriers to discharge        Co-evaluation               AM-PAC PT "6 Clicks" Mobility  Outcome Measure Help needed turning from your back to your side while in a flat bed without using bedrails?: A Little Help needed moving from lying on your back to sitting on the side of a flat bed without using bedrails?: A Little Help needed moving to and from a bed to a chair (including a wheelchair)?: A Little Help needed standing up from a chair using your arms (e.g., wheelchair or bedside chair)?: A Little Help needed to walk in hospital room?: A Little Help needed climbing 3-5 steps with a railing? : A Little 6 Click Score: 18    End of Session   Activity Tolerance: Patient tolerated treatment well Patient left: in chair;with call bell/phone within reach;with  chair alarm set;with family/visitor present Nurse Communication: Mobility status PT Visit Diagnosis: Other abnormalities of gait and mobility (R26.89);Muscle weakness (generalized) (M62.81)    Time: 8185-6314 PT Time Calculation (min) (ACUTE ONLY): 31 min   Charges:   PT Evaluation $PT Eval Low Complexity: 1 Low          Arlyss Gandy, PT, DPT Acute Rehabilitation Pager: [phone]   Arlyss Gandy 11/08/2020, 10:56 AM

## 2020-11-08 NOTE — Evaluation (Signed)
Occupational Therapy Evaluation Patient Details Name: Veronica Cannon MRN: [id number] DOB: [birth year] Today's Date: 11/08/2020    History of Present Illness 35 y.o. female with PMH significant for gestational diabetes, preeclampsia, 84-month history of leg pain who presents with left arm and left leg weakness after L4-L5 microdiscectomy. She was taken back to the OR and the wound was examined with no concern on 11/07/2020. Pt brought to ED after on the same date.   Clinical Impression   PTA patient was living in a private residence with her spouse and children and was independent with ADLs/IADLs without use of AD. Patient currently functioning below baseline requiring Min guard to Min A grossly for ADLs, ADL transfers, and short-distance functional mobility with use of RW. Patient also limited by LUE/LLE weakness with noted improvement since admission and decreased static/dynamic standing balance. Patient would benefit from continued acute OT services to maximize independence with self-care tasks in prep for d/c home with assist from family as needed.     Follow Up Recommendations  Outpatient OT;Supervision/Assistance - 24 hour    Equipment Recommendations  3 in 1 bedside commode    Recommendations for Other Services       Precautions / Restrictions Precautions Precautions: Back Precaution Booklet Issued: No Precaution Comments: Patient able to recall 3/3 back precautions. Restrictions Weight Bearing Restrictions: No      Mobility Bed Mobility Overal bed mobility: Needs Assistance Bed Mobility: Rolling;Sidelying to Sit;Sit to Sidelying Rolling: Supervision Sidelying to sit: Supervision     Sit to sidelying: Supervision General bed mobility comments: Supervision A with min cues for log-rolling technique.    Transfers Overall transfer level: Needs assistance Equipment used: Rolling walker (2 wheeled) Transfers: Sit to/from Stand Sit to Stand: Supervision;Min guard          General transfer comment: Min guard for steadying initially. 2nd attempt with supervision A.    Balance Overall balance assessment: Needs assistance Sitting-balance support: No upper extremity supported;Feet supported Sitting balance-Leahy Scale: Good     Standing balance support: Single extremity supported;Bilateral upper extremity supported Standing balance-Leahy Scale: Fair Standing balance comment: reliant on UE support of RW with mobility. Able to maintain static standing balance without UE support.                           ADL either performed or assessed with clinical judgement   ADL Overall ADL's : Needs assistance/impaired                 Upper Body Dressing : Set up;Sitting   Lower Body Dressing: Minimal assistance;Sit to/from stand   Toilet Transfer: Personnel officer Details (indicate cue type and reason): Simualted with stand-pivot to EOB.         Functional mobility during ADLs: Min guard;Supervision/safety General ADL Comments: Min guard initally progressing to supervision A. Patient requires more than increased time.     Vision Baseline Vision/History: Wears glasses Wears Glasses: At all times Vision Assessment?: No apparent visual deficits     Perception     Praxis      Pertinent Vitals/Pain Pain Assessment: Faces Faces Pain Scale: Hurts a little bit Pain Location: back Pain Descriptors / Indicators: Aching Pain Intervention(s): Monitored during session     Hand Dominance Right   Extremity/Trunk Assessment Upper Extremity Assessment Upper Extremity Assessment: LUE deficits/detail LUE Deficits / Details: 4-/5 grossly with formal assessment LUE Sensation: WNL LUE Coordination: WNL   Lower Extremity Assessment Lower  Extremity Assessment: Defer to PT evaluation   Cervical / Trunk Assessment Cervical / Trunk Assessment: Normal   Communication Communication Communication: No difficulties  (Soft spoken)   Cognition Arousal/Alertness: Awake/alert Behavior During Therapy: WFL for tasks assessed/performed Overall Cognitive Status: Within Functional Limits for tasks assessed                                     General Comments  Clean, dry dressing at incision.    Exercises     Shoulder Instructions      Home Living Family/patient expects to be discharged to:: Private residence Living Arrangements: Spouse/significant other;Children Available Help at Discharge: Family;Available 24 hours/day Type of Home: House Home Access: Level entry     Home Layout: Two level Alternate Level Stairs-Number of Steps: flight Alternate Level Stairs-Rails: Right (L halfway) Bathroom Shower/Tub: Producer, television/film/video: Standard     Home Equipment: None          Prior Functioning/Environment Level of Independence: Independent        Comments: Independent with ADLs/IADLs without AD. Patient was driving and looking after her 2 children.        OT Problem List: Decreased strength;Decreased range of motion;Impaired balance (sitting and/or standing);Decreased knowledge of use of DME or AE;Impaired UE functional use      OT Treatment/Interventions: Self-care/ADL training;Therapeutic exercise;Energy conservation;DME and/or AE instruction;Therapeutic activities;Patient/family education;Balance training    OT Goals(Current goals can be found in the care plan section) Acute Rehab OT Goals Patient Stated Goal: to return to independent mobility OT Goal Formulation: With patient Time For Goal Achievement: 11/22/20 Potential to Achieve Goals: Good ADL Goals Pt Will Perform Grooming: with modified independence;standing Pt Will Perform Upper Body Dressing: sitting;with modified independence Pt Will Perform Lower Body Dressing: with modified independence;with adaptive equipment;sit to/from stand Pt Will Transfer to Toilet: with modified  independence;ambulating;bedside commode Pt Will Perform Toileting - Clothing Manipulation and hygiene: with modified independence;sit to/from stand Additional ADL Goal #1: Patient will tolerate 2 sets x10 reps each of BUE HEP to maximize strength in prep for ADLs/IADLs.  OT Frequency: Min 2X/week   Barriers to D/C:            Co-evaluation              AM-PAC OT "6 Clicks" Daily Activity     Outcome Measure Help from another person eating meals?: None Help from another person taking care of personal grooming?: None Help from another person toileting, which includes using toliet, bedpan, or urinal?: A Little Help from another person bathing (including washing, rinsing, drying)?: A Little Help from another person to put on and taking off regular upper body clothing?: None Help from another person to put on and taking off regular lower body clothing?: A Little 6 Click Score: 21   End of Session Equipment Utilized During Treatment: Gait belt;Rolling walker  Activity Tolerance: Patient tolerated treatment well Patient left: in bed;with call bell/phone within reach;with bed alarm set;with family/visitor present  OT Visit Diagnosis: Unsteadiness on feet (R26.81);Muscle weakness (generalized) (M62.81)                Time: 0737-1062 OT Time Calculation (min): 19 min Charges:  OT General Charges $OT Visit: 1 Visit OT Evaluation $OT Eval Low Complexity: 1 Low  Arjan Strohm H. OTR/L Supplemental OT, Department of rehab services [phone]  [place] Latka R H. 11/08/2020, 2:48 PM

## 2020-11-08 NOTE — Plan of Care (Signed)

## 2020-11-08 NOTE — Discharge Summary (Signed)
Physician Discharge Summary  Patient ID: Veronica Cannon MRN: [id number] DOB/AGE: 1986/04/13 35 y.o.  Admit date: 11/07/2020 Discharge date: 11/08/2020  Admission Diagnoses: left leg weakness    Discharge Diagnoses: same   Discharged Condition: good  Hospital Course: The patient was admitted on 11/07/2020 and taken to the operating room where the patient underwent microdiskectomy at the surgery center and woke up with left leg weakness. She was transferred to the [facility] and worked up for stroke which was all negative.    Consults: None  Significant Diagnostic Studies:  Results for orders placed or performed during the hospital encounter of 11/07/20  Resp Panel by RT-PCR (Flu A&B, Covid) Nasopharyngeal Swab   Specimen: Nasopharyngeal Swab; Nasopharyngeal(NP) swabs in vial transport medium  Result Value Ref Range   SARS Coronavirus 2 by RT PCR NEGATIVE NEGATIVE   Influenza A by PCR NEGATIVE NEGATIVE   Influenza B by PCR NEGATIVE NEGATIVE  Protime-INR  Result Value Ref Range   Prothrombin Time 13.5 11.4 - 15.2 seconds   INR 1.1 0.8 - 1.2  APTT  Result Value Ref Range   aPTT 30 24 - 36 seconds  CBC  Result Value Ref Range   WBC 10.2 4.0 - 10.5 K/uL   RBC 6.02 (H) 3.87 - 5.11 MIL/uL   Hemoglobin 11.9 (L) 12.0 - 15.0 g/dL   HCT 37.1 06.2 - 69.4 %   MCV 68.8 (L) 80.0 - 100.0 fL   MCH 19.8 (L) 26.0 - 34.0 pg   MCHC 28.7 (L) 30.0 - 36.0 g/dL   RDW 85.4 62.7 - 03.5 %   Platelets 336 150 - 400 K/uL   nRBC 0.0 0.0 - 0.2 %  Differential  Result Value Ref Range   Neutrophils Relative % 86 %   Neutro Abs 8.8 (H) 1.7 - 7.7 K/uL   Lymphocytes Relative 12 %   Lymphs Abs 1.3 0.7 - 4.0 K/uL   Monocytes Relative 1 %   Monocytes Absolute 0.1 0.1 - 1.0 K/uL   Eosinophils Relative 0 %   Eosinophils Absolute 0.0 0.0 - 0.5 K/uL   Basophils Relative 0 %   Basophils Absolute 0.0 0.0 - 0.1 K/uL   Immature Granulocytes 1 %   Abs Immature Granulocytes 0.05 0.00 - 0.07 K/uL   Comprehensive metabolic panel  Result Value Ref Range   Sodium 138 135 - 145 mmol/L   Potassium 3.9 3.5 - 5.1 mmol/L   Chloride 103 98 - 111 mmol/L   CO2 24 22 - 32 mmol/L   Glucose, Bld 223 (H) 70 - 99 mg/dL   BUN 6 6 - 20 mg/dL   Creatinine, Ser 0.09 0.44 - 1.00 mg/dL   Calcium 9.1 8.9 - 38.1 mg/dL   Total Protein 7.8 6.5 - 8.1 g/dL   Albumin 3.6 3.5 - 5.0 g/dL   AST 56 (H) 15 - 41 U/L   ALT 75 (H) 0 - 44 U/L   Alkaline Phosphatase 57 38 - 126 U/L   Total Bilirubin 0.8 0.3 - 1.2 mg/dL   GFR, Estimated >82 >99 mL/min   Anion gap 11 5 - 15  Urine rapid drug screen (hosp performed)  Result Value Ref Range   Opiates NONE DETECTED NONE DETECTED   Cocaine NONE DETECTED NONE DETECTED   Benzodiazepines POSITIVE (A) NONE DETECTED   Amphetamines NONE DETECTED NONE DETECTED   Tetrahydrocannabinol NONE DETECTED NONE DETECTED   Barbiturates NONE DETECTED NONE DETECTED  Urinalysis, Routine w reflex microscopic Urine, Clean Catch  Result Value  Ref Range   Color, Urine STRAW (A) YELLOW   APPearance CLEAR CLEAR   Specific Gravity, Urine 1.006 1.005 - 1.030   pH 7.0 5.0 - 8.0   Glucose, UA 150 (A) NEGATIVE mg/dL   Hgb urine dipstick NEGATIVE NEGATIVE   Bilirubin Urine NEGATIVE NEGATIVE   Ketones, ur NEGATIVE NEGATIVE mg/dL   Protein, ur NEGATIVE NEGATIVE mg/dL   Nitrite NEGATIVE NEGATIVE   Leukocytes,Ua NEGATIVE NEGATIVE  Glucose, capillary  Result Value Ref Range   Glucose-Capillary 168 (H) 70 - 99 mg/dL   Comment 1 Notify RN    Comment 2 Document in Chart   I-Stat beta hCG blood, ED  Result Value Ref Range   I-stat hCG, quantitative <5.0 <5 mIU/mL   Comment 3          CBG monitoring, ED  Result Value Ref Range   Glucose-Capillary 195 (H) 70 - 99 mg/dL    MR BRAIN WO CONTRAST  Result Date: 11/07/2020 CLINICAL DATA:  Initial evaluation for acute neuro deficit, stroke suspected. EXAM: MRI HEAD WITHOUT CONTRAST TECHNIQUE: Multiplanar, multiecho pulse sequences of the brain and  surrounding structures were obtained without intravenous contrast. COMPARISON:  Comparison made with prior CTA and CT from earlier the same day. FINDINGS: Brain: Cerebral volume within normal limits for age. Few scattered subcentimeter foci of T2/FLAIR hyperintensity noted involving the periventricular, deep, and subcortical white matter both cerebral hemispheres, nonspecific, but overall mild in nature. No abnormal foci of restricted diffusion to suggest acute or subacute ischemia. Gray-white matter differentiation maintained. No encephalomalacia to suggest chronic cortical infarction. No foci of susceptibility artifact to suggest acute or chronic intracranial hemorrhage. 7 mm calcified meningioma overlies the left frontal convexity (series 5, image 15). No associated edema or mass effect. No other mass lesion. No midline shift or hydrocephalus. No extra-axial fluid collection. Pituitary gland suprasellar region within normal limits. Midline structures intact. Vascular: Major intracranial vascular flow voids are maintained. Skull and upper cervical spine: Craniocervical junction within normal limits. Upper cervical spine normal. Bone marrow signal intensity within normal limits. No scalp soft tissue abnormality. Sinuses/Orbits: Globes and orbital soft tissues within normal limits. Paranasal sinuses are largely clear. No significant mastoid effusion. Inner ear structures normal. Other: None. IMPRESSION: 1. No acute intracranial abnormality. 2. Scattered subcentimeter foci of T2/FLAIR hyperintensity involving the supratentorial cerebral white matter, nonspecific, but overall mild for age. Differential considerations are broad, and include changes of chronic small-vessel ischemia, sequelae of prior trauma, hypercoagulable state, vasculitis, sequelae of complicated migraines, prior infectious or inflammatory process, or demyelination. 3. 7 mm calcified meningioma overlying the left frontal convexity without associated  mass effect. Electronically Signed   By: Rise Mu M.D.   On: 11/07/2020 19:59   MR LUMBAR SPINE WO CONTRAST  Result Date: 11/07/2020 CLINICAL DATA:  Low back pain, progressive neurological deficit. EXAM: MRI LUMBAR SPINE WITHOUT CONTRAST TECHNIQUE: Multiplanar, multisequence MR imaging of the lumbar spine was performed. No intravenous contrast was administered. COMPARISON:  MRI of the lumbar spine October 04, 2020. FINDINGS: Segmentation:  Standard. Alignment:  Physiologic. Vertebrae: No acute fracture, evidence of discitis, or bone lesion. Bilateral L5 spondylolisthesis without anterolisthesis. Postsurgical changes from left laminectomy and micro discectomy. Conus medullaris and cauda equina: Conus extends to the T12-L1 level. Conus and cauda equina appear normal. Paraspinal and other soft tissues: Expected postsurgical changes in the paraspinal musculature from left L4-5 laminectomy. Disc levels: T12-L1: Small posterior disc protrusion without significant spinal canal or neural foraminal stenosis. L1-2: No spinal canal  or neural foraminal stenosis. L2-3: No spinal canal or neural foraminal stenosis. L3-4: No spinal canal or neural foraminal stenosis. L4-5: Postsurgical changes from recent left laminectomy and micro discectomy with T2 hyperintense fluid within the disc/ left subarticular zone and left posterolateral T2 hypointensity likely reflecting serosanguineous collection which extends inferiorly to the L5-S1 level. Small residual central disc extrusion is also seen. Findings result in mild-to-moderate narrowing of the thecal sac and left subarticular zone. No neural foraminal narrowing. L5-S1: Mild facet degenerative changes. No spinal canal or neural foraminal stenosis. Left posterolateral inferior extension of the L4-5 fluid collection causes mild mass effect on the thecal sac at this level. IMPRESSION: Expected postsurgical changes from recent left laminectomy and micro discectomy at L4-L5.  Left subarticular zone and left posterolateral collection at this level, extending inferiorly to the L5-S1 level, causing mild to moderate mass effect on the thecal sac. Electronically Signed   By: Baldemar Lenis M.D.   On: 11/07/2020 20:06   CT HEAD CODE STROKE WO CONTRAST  Result Date: 11/07/2020 CLINICAL DATA:  Code stroke. Acute neuro deficit. Left-sided weakness EXAM: CT HEAD WITHOUT CONTRAST TECHNIQUE: Contiguous axial images were obtained from the base of the skull through the vertex without intravenous contrast. COMPARISON:  None. FINDINGS: Brain: No evidence of acute infarction, hemorrhage, hydrocephalus, extra-axial collection or mass lesion/mass effect. Vascular: Negative for hyperdense vessel Skull: Negative Sinuses/Orbits: Small air-fluid level left sphenoid sinus. Mild mucosal edema in the maxillary sinus bilaterally Other: None ASPECTS (Alberta Stroke Program Early CT Score) - Ganglionic level infarction (caudate, lentiform nuclei, internal capsule, insula, M1-M3 cortex): 7 - Supraganglionic infarction (M4-M6 cortex): 3 Total score (0-10 with 10 being normal): 10 IMPRESSION: 1. Negative CT of the brain 2. ASPECTS is 10 3. Small air-fluid level sphenoid sinus and mucosal edema in the maxillary sinus bilaterally. 4. Code stroke imaging results were communicated on 11/07/2020 at 5:22 pm to provider Derry Lory via text page Electronically Signed   By: Marlan Palau M.D.   On: 11/07/2020 17:23   CT ANGIO HEAD CODE STROKE  Result Date: 11/07/2020 CLINICAL DATA:  Stroke/TIA, assess extracranial arteries. Stroke/TIA, assess intracranial arteries. Left-sided weakness. EXAM: CT ANGIOGRAPHY HEAD AND NECK TECHNIQUE: Multidetector CT imaging of the head and neck was performed using the standard protocol during bolus administration of intravenous contrast. Multiplanar CT image reconstructions and MIPs were obtained to evaluate the vascular anatomy. Carotid stenosis measurements (when applicable)  are obtained utilizing NASCET criteria, using the distal internal carotid diameter as the denominator. CONTRAST:  35mL OMNIPAQUE IOHEXOL 350 MG/ML SOLN COMPARISON:  Noncontrast head CT 11/07/2020. FINDINGS: CTA NECK FINDINGS Aortic arch: Standard aortic branching. The visualized aortic arch is unremarkable. No hemodynamically significant innominate or proximal subclavian artery stenosis. Right carotid system: CCA and ICA patent within the neck without stenosis. No significant atherosclerotic disease. Left carotid system: CCA and ICA patent within the neck without stenosis. No significant atherosclerotic disease. Vertebral arteries: Codominant and patent within the neck without stenosis. Skeleton: No acute bony abnormality or aggressive osseous lesion. Cervical levocurvature with partially imaged thoracic dextrocurvature. Other neck: Subcentimeter thyroid nodules not meeting consensus criteria for ultrasound follow-up. Nonspecific mildly enlarged left level 2 lymph node measuring 12 mm in short axis (series 7, image 160). Upper chest: Patchy and linear opacities within the imaged right lung apex may reflect atelectasis or pneumonia. Review of the MIP images confirms the above findings CTA HEAD FINDINGS Anterior circulation: The intracranial internal carotid arteries are patent. The M1 middle cerebral arteries  are patent. No M2 proximal branch occlusion or high-grade proximal stenosis is identified. The anterior cerebral arteries are patent. No intracranial aneurysm is identified. Posterior circulation: The intracranial vertebral arteries are patent. The basilar artery is patent. The posterior cerebral arteries are patent. Posterior communicating arteries are present bilaterally. Venous sinuses: Within the limitations of contrast timing, no convincing thrombus. Anatomic variants: None significant Other: 7 mm partially calcified dural-based focus overlying the mid left frontal lobe likely reflecting an incidental tiny  meningioma. Review of the MIP images confirms the above findings These results were communicated to Dr. Derry Lory At 5:49 pmon 1/5/2022by text page via the [facility] messaging system. IMPRESSION: CTA neck: 1. The common carotid, internal carotid and vertebral arteries are patent within the neck without hemodynamically significant stenosis. 2. Patchy and linear opacities within the imaged right lung apex, which may reflect atelectasis or pneumonia. 3. Nonspecific mildly enlarged left level 2 lymph node. Clinical correlation recommended. CTA head: 1. No intracranial large vessel occlusion or proximal high-grade arterial stenosis. 2. 7 mm probable incidental meningioma overlying the mid left frontal lobe. Electronically Signed   By: Jackey Loge DO   On: 11/07/2020 17:52   CT ANGIO NECK CODE STROKE  Result Date: 11/07/2020 CLINICAL DATA:  Stroke/TIA, assess extracranial arteries. Stroke/TIA, assess intracranial arteries. Left-sided weakness. EXAM: CT ANGIOGRAPHY HEAD AND NECK TECHNIQUE: Multidetector CT imaging of the head and neck was performed using the standard protocol during bolus administration of intravenous contrast. Multiplanar CT image reconstructions and MIPs were obtained to evaluate the vascular anatomy. Carotid stenosis measurements (when applicable) are obtained utilizing NASCET criteria, using the distal internal carotid diameter as the denominator. CONTRAST:  7mL OMNIPAQUE IOHEXOL 350 MG/ML SOLN COMPARISON:  Noncontrast head CT 11/07/2020. FINDINGS: CTA NECK FINDINGS Aortic arch: Standard aortic branching. The visualized aortic arch is unremarkable. No hemodynamically significant innominate or proximal subclavian artery stenosis. Right carotid system: CCA and ICA patent within the neck without stenosis. No significant atherosclerotic disease. Left carotid system: CCA and ICA patent within the neck without stenosis. No significant atherosclerotic disease. Vertebral arteries: Codominant and patent  within the neck without stenosis. Skeleton: No acute bony abnormality or aggressive osseous lesion. Cervical levocurvature with partially imaged thoracic dextrocurvature. Other neck: Subcentimeter thyroid nodules not meeting consensus criteria for ultrasound follow-up. Nonspecific mildly enlarged left level 2 lymph node measuring 12 mm in short axis (series 7, image 160). Upper chest: Patchy and linear opacities within the imaged right lung apex may reflect atelectasis or pneumonia. Review of the MIP images confirms the above findings CTA HEAD FINDINGS Anterior circulation: The intracranial internal carotid arteries are patent. The M1 middle cerebral arteries are patent. No M2 proximal branch occlusion or high-grade proximal stenosis is identified. The anterior cerebral arteries are patent. No intracranial aneurysm is identified. Posterior circulation: The intracranial vertebral arteries are patent. The basilar artery is patent. The posterior cerebral arteries are patent. Posterior communicating arteries are present bilaterally. Venous sinuses: Within the limitations of contrast timing, no convincing thrombus. Anatomic variants: None significant Other: 7 mm partially calcified dural-based focus overlying the mid left frontal lobe likely reflecting an incidental tiny meningioma. Review of the MIP images confirms the above findings These results were communicated to Dr. Derry Lory At 5:49 pmon 1/5/2022by text page via the [facility] messaging system. IMPRESSION: CTA neck: 1. The common carotid, internal carotid and vertebral arteries are patent within the neck without hemodynamically significant stenosis. 2. Patchy and linear opacities within the imaged right lung apex, which may reflect atelectasis or  pneumonia. 3. Nonspecific mildly enlarged left level 2 lymph node. Clinical correlation recommended. CTA head: 1. No intracranial large vessel occlusion or proximal high-grade arterial stenosis. 2. 7 mm probable incidental  meningioma overlying the mid left frontal lobe. Electronically Signed   By: Kellie Simmering DO   On: 11/07/2020 17:52    Antibiotics:  Anti-infectives (From admission, onward)   None      Discharge Exam: Blood pressure 120/70, pulse 75, temperature (!) 97.4 F (36.3 C), temperature source Oral, resp. rate 18, height 5\' 6"  (1.676 m), weight 77.9 kg, SpO2 96 %. Neurologic: Grossly normal Ambulating and voiding well, incision cdi   Discharge Medications:   Allergies as of 11/08/2020      Reactions   Cefuroxime Hives   Latex Rash      Medication List    STOP taking these medications   naproxen 500 MG tablet Commonly known as: NAPROSYN     TAKE these medications   metFORMIN 500 MG 24 hr tablet Commonly known as: GLUCOPHAGE-XR Take 500 mg by mouth daily.   methocarbamol 500 MG tablet Commonly known as: ROBAXIN Take 500 mg by mouth every 6 (six) hours as needed for muscle spasms.   methylPREDNISolone 4 MG Tbpk tablet Commonly known as: MEDROL DOSEPAK Take 4-24 mg by mouth See admin instructions. Day 1: 8mg  before breakfast, 4mg  after lunch, 4mg  after supper, 8mg  at bedtime. Day 2: 4mg  before breakfast, 4mg  after lunch, 4mg  after supper, 8mg  at bedtime. Day 3: 4mg  before breakfast, 4mg  after lunch, 4mg  after supper, 4mg  at bedtime. Day 4: 4mg  before breakfast, 4mg  after lunch, 4mg  at bedtime. Day 5: 4mg  before breakfast, 4mg  at bedtime. Day 6: 4mg  before breakfast.   oxyCODONE-acetaminophen 5-325 MG tablet Commonly known as: PERCOCET/ROXICET Take 1 tablet by mouth every 6 (six) hours as needed for severe pain.            Durable Medical Equipment  (From admission, onward)         Start     Ordered   11/08/20 1625  For home use only DME 3 n 1  Once        11/08/20 1625   11/08/20 1625  For home use only DME Walker rolling  Once       Question Answer Comment  Walker: With [employer]   Patient needs a walker to treat with the following condition Weakness       11/08/20 1625          Disposition: home  Final Dx: left l4-5 microdiskectomy  Discharge Instructions    Diet - low sodium heart healthy   Complete by: As directed    Increase activity slowly   Complete by: As directed          Signed: Ocie Cornfield Yusuf Yu 11/08/2020, 5:20 PM

## 2020-11-08 NOTE — Progress Notes (Signed)
Pt reports itching on the arms and legs. Pt did not receive any new medication or food prior to itching. Pt denies any trouble breathing or any noticeable changes aside from itching at the arms. RN continue to monitor MD notified. See new orders

## 2020-11-08 NOTE — H&P (Signed)
Subjective: Patient is a 35 y.o. female who complains of left-sided weakness after L4-5 microdiscectomy yesterday.  At first it was the entire left leg and seemed to be somewhat effort dependent as we saw her spontaneously move the leg at times.  She had very severe pain in her back after the surgery.  We were concerned that she could have a hematoma and therefore took her back to the operating room for exploration of her surgery.  We found no significant hematoma.  She did awake from the surgery with much less pain.  However, she still had subjective weakness in the left leg.  We continue to witness her move the leg spontaneously including full dorsiflexion and knee flexion but when we would ask her to move the legs she would state that she could not.  She had a positive Hoover's sign in the recovery room.  Then she started to experience left arm weakness on exam.  This appeared once again to be effort dependent.  She was therefore transferred to the [facility] emergency department for expected admission and imaging.  She was seen by neurology who also felt this was a nonorganic cause of weakness.  They did not suspect stroke or injury.  She seems better this morning and denies pain.  She describes some numbness in the left leg but states that she has full use of her upper extremities and seems to be moving her left lower extremity much better.  She is asking to go home if she walks with therapy.[facility] MRI showed no stroke on the cranial imaging and expected postoperative changes in the lumbar imaging.  There were no findings to explain her left-sided weakness.  Past Medical History:  Diagnosis Date  . Gestational diabetes mellitus, antepartum   . Pre-eclampsia, severe, delivered 01/30/2014    Past Surgical History:  Procedure Laterality Date  . CESAREAN SECTION    . CESAREAN SECTION N/A 01/29/2014   Procedure: Repeat Cesarean Section Delivery Baby Girl  @ 2112,  Apgars 8/9;  Surgeon: Essie Hart, MD;   Location: [facility];  Service: Obstetrics;  Laterality: N/A;  . COLONOSCOPY N/A 10/13/2018   Procedure: COLONOSCOPY;  Surgeon: Romie Levee, MD;  Location: [facility];  Service: Endoscopy;  Laterality: N/A;    Allergies  Allergen Reactions  . Cefuroxime Hives  . Latex Rash    Social History   Tobacco Use  . Smoking status: Never Smoker  . Smokeless tobacco: Never Used  Substance Use Topics  . Alcohol use: No    Family History  Problem Relation Age of Onset  . Cancer Other   . Diabetes Other   . Hypertension Other   . Heart disease Other    Prior to Admission medications   Medication Sig Start Date End Date Taking? Authorizing Provider  metFORMIN (GLUCOPHAGE) 500 MG tablet Take 500 mg by mouth daily.    [provider]  naproxen (NAPROSYN) 500 MG tablet Take 1 tablet (500 mg total) by mouth 2 (two) times daily. 10/12/14   Dione Booze, MD     Review of Systems  Positive ROS: neg  All other systems have been reviewed and were otherwise negative with the exception of those mentioned in the HPI and as above.  Objective: Vital signs in last 24 hours: Temp:  [98 F (36.7 C)-99.4 F (37.4 C)] 98 F (36.7 C) (01/06 0727) Pulse Rate:  [83-114] 83 (01/06 0727) Resp:  [16-28] 16 (01/06 0727) BP: (106-151)/(63-123) 106/64 (01/06 0727) SpO2:  [92 %-100 %]  97 % (01/06 0727) Weight:  [77.9 kg] 77.9 kg (01/05 2158)  General Appearance: Alert, cooperative, no distress, appears stated age Head: Normocephalic, without obvious abnormality, atraumatic Eyes: PERRL, conjunctiva/corneas clear, EOM's intact, fundi benign, both eyes      Ears: Normal TM's and external ear canals, both ears Throat: Lips, mucosa, and tongue normal; teeth and gums normal Neck: Supple, symmetrical, trachea midline, no adenopathy; thyroid: No enlargement/tenderness/nodules; no carotid bruit or JVD Back: Symmetric, no curvature, ROM normal, no CVA tenderness Lungs: Clear to auscultation bilaterally,  respirations unlabored Heart: Regular rate and rhythm, S1 and S2 normal, no murmur, rub or gallop Abdomen: Soft, non-tender, bowel sounds active all four quadrants, no masses, no organomegaly Extremities: Extremities normal, atraumatic, no cyanosis or edema Pulses: 2+ and symmetric all extremities Skin: Skin color, texture, turgor normal, no rashes or lesions  NEUROLOGIC:   Mental status: alert and oriented, no aphasia, good attention span, Fund of knowledge/ memory ok Motor Exam - grossly normal except for left lower extremity where there is still effort dependent antigravity strength in the lower extremity now with ratcheting movements Sensory Exam - grossly normal Reflexes: Normal Coordination - grossly normal Gait - grossly normal Balance - grossly normal Cranial Nerves: I: smell Not tested  II: visual acuity  OS: na    OD: na  II: visual fields Full to confrontation  II: pupils Equal, round, reactive to light  III,VII: ptosis None  III,IV,VI: extraocular muscles  Full ROM  V: mastication Normal  V: facial light touch sensation  Normal  V,VII: corneal reflex  Present  VII: facial muscle function - upper  Normal  VII: facial muscle function - lower Normal  VIII: hearing Not tested  IX: soft palate elevation  Normal  IX,X: gag reflex Present  XI: trapezius strength  5/5  XI: sternocleidomastoid strength 5/5  XI: neck flexion strength  5/5  XII: tongue strength  Normal    Data Review Lab Results  Component Value Date   WBC 10.2 11/07/2020   HGB 11.9 (L) 11/07/2020   HCT 41.4 11/07/2020   MCV 68.8 (L) 11/07/2020   PLT 336 11/07/2020   Lab Results  Component Value Date   NA 138 11/07/2020   K 3.9 11/07/2020   CL 103 11/07/2020   CO2 24 11/07/2020   BUN 6 11/07/2020   CREATININE 0.62 11/07/2020   GLUCOSE 223 (H) 11/07/2020   Lab Results  Component Value Date   INR 1.1 11/07/2020    Assessment/Plan: She is much better today.  I suspect this will some type of  conversion reaction.  She seems to be doing much better and her pain is obviously controlled.  She would like to go home if she does well with therapy.  I think that is reasonable.  We will see how she does with therapy today and make further treatment decisions based on that.  I do not believe she needs further imaging at this time.   Tia Alert 11/08/2020 7:50 AM

## 2020-11-08 NOTE — Progress Notes (Signed)
This patient is being discharged home via wheelchair to personal car. Pt has all belongings sent with her. All IVS has been disconnected and pt was sent in good spirirts.

## 2020-11-08 NOTE — Progress Notes (Signed)
NEUROLOGY CONSULTATION PROGRESS NOTE   Date of service: November 08, 2020 Patient Name: Veronica Cannon MRN:  [id number] DOB:  February 18, 1986  Brief HPI   Veronica Cannon is a 35 y.o. female presenting with left arm and left leg weakness noted in the postop period after L4-L5 microdiscectomy. Exam most concerning for functional weakness. Workup with CTH, CTA, MRI Brain negative for an acute stroke.   Interval Hx   She has spontaneous improvement in her LUE and LLE weakness. Still weaker compared to the right but significantly improved.  Vitals   Vitals:   11/08/20 0024 11/08/20 0224 11/08/20 0424 11/08/20 0727  BP: 117/66 123/65 110/63 106/64  Pulse: 99 96 91 83  Resp:  19 20 16   Temp: 98.1 F (36.7 C) 98.1 F (36.7 C) 98.7 F (37.1 C) 98 F (36.7 C)  TempSrc: Oral Oral Oral Oral  SpO2: 96% 96% 97% 97%  Weight:      Height:         Body mass index is 27.72 kg/m.  Physical Exam   General: Laying comfortably in bed; in no acute distress.  HENT: Normal oropharynx and mucosa. Normal external appearance of ears and nose.  Neck: Supple, no pain or tenderness  CV: No JVD. No peripheral edema.  Pulmonary: Symmetric Chest rise. Normal respiratory effort.  Abdomen: Soft to touch, non-tender.  Ext: No cyanosis, edema, or deformity  Skin: No rash. Normal palpation of skin.   Musculoskeletal: Normal digits and nails by inspection. No clubbing.   Neurologic Examination  Mental status/Cognition: Alert, oriented to self, place, month and year, good attention.  Speech/language: Fluent, comprehension intact, object naming intact, repetition intact.  Cranial nerves:   CN II Pupils equal and reactive to light, no VF deficits    CN III,IV,VI EOM intact, no gaze preference or deviation, no nystagmus    CN V normal sensation in V1, V2, and V3 segments bilaterally    CN VII no asymmetry, no nasolabial fold flattening    CN VIII normal hearing to speech    CN IX & X normal palatal  elevation, no uvular deviation    CN XI 5/5 head turn and 5/5 shoulder shrug bilaterally    CN XII midline tongue protrusion    Motor:  Muscle bulk: normal, tone normal, pronator drift none tremor none Mvmt Root Nerve  Muscle Right Left Comments  SA C5/6 Ax Deltoid 5 5   EF C5/6 Mc Biceps 5 5   EE C6/7/8 Rad Triceps 5 5   WF C6/7 Med FCR 5 4   WE C7/8 PIN ECU 5 4   F Ab C8/T1 U ADM/FDI 5 4   HF L1/2/3 Fem Illopsoas 5 4+   KE L2/3/4 Fem Quad 5 5   DF L4/5 D Peron Tib Ant 5 3   PF S1/2 Tibial Grc/Sol 5 3    Reflexes:  Right Left Comments  Pectoralis      Biceps (C5/6) 2 2   Brachioradialis (C5/6) 2 2    Triceps (C6/7) 2 2    Patellar (L3/4) 2 2    Achilles (S1) withdraws down    Hoffman      Plantar     Jaw jerk    Sensation:  Light touch Intact throughout   Pin prick    Temperature    Vibration   Proprioception    Coordination/Complex Motor:  - Finger to Nose intact BL - Heel to shin unable to assess due to weakness. - Rapid alternating  movement are slowed  Labs   Basic Metabolic Panel:  Lab Results  Component Value Date   NA 138 11/07/2020   K 3.9 11/07/2020   CO2 24 11/07/2020   GLUCOSE 223 (H) 11/07/2020   BUN 6 11/07/2020   CREATININE 0.62 11/07/2020   CALCIUM 9.1 11/07/2020   GFRNONAA >60 11/07/2020   GFRAA >90 10/11/2014   HbA1c: No results found for: HGBA1C LDL: No results found for: [facility] Urine Drug Screen:     Component Value Date/Time   LABOPIA NONE DETECTED 11/07/2020 1649   COCAINSCRNUR NONE DETECTED 11/07/2020 1649   LABBENZ POSITIVE (A) 11/07/2020 1649   AMPHETMU NONE DETECTED 11/07/2020 1649   THCU NONE DETECTED 11/07/2020 1649   LABBARB NONE DETECTED 11/07/2020 1649    Alcohol Level No results found for: ETH No results found for: PHENYTOIN, ZONISAMIDE, LAMOTRIGINE, LEVETIRACETA No results found for: PHENYTOIN, PHENOBARB, VALPROATE, CBMZ  Imaging and Diagnostic studies   CT Head without contrast: CTH was negative for a large  hypodensity concerning for a large territory infarct or hyperdensity concerning for an ICH  CT angio Head and Neck with contrast: No LVO.  MRI Brain  IMPRESSION: 1. No acute intracranial abnormality. 2. Scattered subcentimeter foci of T2/FLAIR hyperintensity involving the supratentorial cerebral white matter, nonspecific, but overall mild for age. Differential considerations are broad, and include changes of chronic small-vessel ischemia, sequelae of prior trauma, hypercoagulable state, vasculitis, sequelae of complicated migraines, prior infectious or inflammatory process, or demyelination. 3. 7 mm calcified meningioma overlying the left frontal convexity without associated mass effect.  Impression   Veronica Cannon is a 35 y.o. female presenting with left arm and left leg weakness noted in the postop period after L4-L5 microdiscectomy. Exam most consistent with functional weakness. Symptoms now spontaneously improving. Imaging negative for a stroke. I do not think that the noted T2/FLAIR hyperintensities are in a pattern that would be consistent with a demyelinating disease and are unlikely to explain her weakness.  Recommendations  - No further inpatient neurological workup, spontaneous improvement is encouraging and consistent with diagnosis of functional neurological disorder. - F/up outpatient with Neurosurgery for the noted meningioma. ______________________________________________________________________   Thank you for the opportunity to take part in the care of this patient. If you have any further questions, please contact the neurology consultation attending.  Signed,  Erick Blinks [facility] Pager Number [phone]

## 2020-12-20 ENCOUNTER — Encounter: Payer: Self-pay | Admitting: Physical Therapy

## 2020-12-20 ENCOUNTER — Other Ambulatory Visit: Payer: Self-pay

## 2020-12-20 ENCOUNTER — Ambulatory Visit: Payer: 59 | Attending: Neurological Surgery | Admitting: Physical Therapy

## 2020-12-20 DIAGNOSIS — R29898 Other symptoms and signs involving the musculoskeletal system: Secondary | ICD-10-CM | POA: Diagnosis present

## 2020-12-20 DIAGNOSIS — M6281 Muscle weakness (generalized): Secondary | ICD-10-CM | POA: Insufficient documentation

## 2020-12-20 DIAGNOSIS — M5416 Radiculopathy, lumbar region: Secondary | ICD-10-CM | POA: Diagnosis not present

## 2020-12-20 NOTE — Therapy (Signed)
[facility] [address]  [address] [place], [place], [address] Phone: [phone]   Fax:  [phone]  Physical Therapy Evaluation  Patient Details  Name: Veronica Cannon MRN: [id number] Date of Birth: July 22, 1986 Referring Provider (PT): Tia Alert, MD   Encounter Date: 12/20/2020   PT End of Session - 12/20/20 1015    Visit Number 1    Date for PT Re-Evaluation 01/31/21    Authorization Type UHC - VL:60 (PT/OT/ST)    PT Start Time 1015    PT Stop Time 1104    PT Time Calculation (min) 49 min    Activity Tolerance Patient tolerated treatment well    Behavior During Therapy [facility] for tasks assessed/performed           Past Medical History:  Diagnosis Date  . Gestational diabetes mellitus, antepartum   . Pre-eclampsia, severe, delivered 01/30/2014    Past Surgical History:  Procedure Laterality Date  . CESAREAN SECTION    . CESAREAN SECTION N/A 01/29/2014   Procedure: Repeat Cesarean Section Delivery Baby Girl  @ 2112,  Apgars 8/9;  Surgeon: Essie Hart, MD;  Location: [facility];  Service: Obstetrics;  Laterality: N/A;  . COLONOSCOPY N/A 10/13/2018   Procedure: COLONOSCOPY;  Surgeon: Romie Levee, MD;  Location: [facility];  Service: Endoscopy;  Laterality: N/A;    There were no vitals filed for this visit.    Subjective Assessment - 12/20/20 1018    Subjective Pt reports she had L4-5 microdiscectomy on 11/07/20 complicated by onset of L LE numbness post-op requiring surgical re-exploration and ER visit/hospitalization for return of numbness/weakness the next day. Pain now better but still experiencing a lot of stiffness.    Pertinent History 11/07/20 - L4-5 microdiscectomy with return to surgery for post-op L LE weakness/numbness    Limitations Sitting;Lifting    How long can you sit comfortably? 30 minutes    Patient Stated Goals "to be my normal self " (feel like she can move around w/o the stiffness)     Currently in Pain? No/denies              [facility] PT Assessment - 12/20/20 1015      Assessment   Medical Diagnosis Lumbar stiffness s/p lumbar microdiscectomy    Referring Provider (PT) Tia Alert, MD    Onset Date/Surgical Date 11/07/20    Hand Dominance Right    Next MD Visit 01/01/21    Prior Therapy none      Precautions   Precautions Back    Precaution Comments no lifting >5#      Restrictions   Weight Bearing Restrictions No      Balance Screen   Has the patient fallen in the past 6 months No    Has the patient had a decrease in activity level because of a fear of falling?  No    Is the patient reluctant to leave their home because of a fear of falling?  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   Leisure outdoorsy, gym 3x/wk - cardio & weight machines      Cognition   Overall  Cognitive Status Within Functional Limits for tasks assessed      Observation/Other Assessments   Focus on Therapeutic Outcomes (FOTO)  Lumbar:  FS=57, predicted discharge FS=73      ROM / Strength   AROM / PROM / Strength AROM;Strength      AROM   Overall AROM Comments limitations due to tightness - denies pain    AROM Assessment Site Lumbar    Lumbar Flexion hands level with knees    Lumbar Extension 50% limited    Lumbar - Right Side Bend hand to knee jt line    Lumbar - Left Side Bend hand to knee jt line    Lumbar - Right Rotation 50% limited    Lumbar - Left Rotation 50% limited      Strength   Strength Assessment Site Hip;Knee;Ankle    Right/Left Hip Right;Left    Right Hip Flexion 4+/5    Right Hip Extension 4/5    Right Hip External Rotation  4/5    Right Hip Internal Rotation 4+/5    Right Hip ABduction  4/5    Right Hip ADduction 4+/5    Left Hip Flexion 4-/5    Left Hip Extension 3+/5    Left Hip External Rotation 3+/5    Left Hip Internal Rotation 3-/5    Left Hip ABduction 3+/5    Left Hip ADduction 3+/5    Right/Left Knee Right;Left    Right Knee Flexion 4/5    Right Knee Extension 4+/5    Left Knee Flexion 4-/5    Left Knee Extension 4-/5    Right/Left Ankle Right;Left    Right Ankle Dorsiflexion 4+/5    Right Ankle Plantar Flexion 4-/5   8 SLS heel raises   Left Ankle Dorsiflexion 4-/5    Left Ankle Plantar Flexion 3/5    Left Ankle Inversion --   able to complete SLS heel raise but with extensive effort     Flexibility   Soft Tissue Assessment /Muscle Length yes    Hamstrings mild/mod tight L>R    Quadriceps mild/mod tight quads & hip flexors B    ITB mild tight B    Piriformis mild tight B    Obturator Internus mod tight B      Palpation   Spinal mobility 2/6 hypomobility with CPAs t/o lumbar spine with increased pain at L4    Palpation comment increased muscle tension & ttp in L lumbar paraspinals, L glutes & piriformis                      Objective measurements completed on examination: See above findings.               PT Education - 12/20/20 1104    Education Details PT eval findings, anticipated POC & initial HEP - Access Code: [email or web address]    Person(s) Educated Patient    Methods Explanation;Demonstration;Verbal cues;Tactile cues;Handout    Comprehension Verbalized understanding;Verbal cues required;Tactile cues required;Returned demonstration;Need further instruction            PT Short Term Goals - 12/20/20 1104      PT SHORT TERM GOAL #1   Title Patient will be independent with initial HEP    Status New    Target Date 01/03/21      PT SHORT TERM GOAL #2   Title Patient will verbalize/demonstrate understanding of neutral spine posture and proper body mechanics to reduce strain on lumbar spine    Status  New    Target Date  01/10/21             PT Long Term Goals - 12/20/20 1104      PT LONG TERM GOAL #1   Title Patient will be independent with ongoing/advanced HEP +/- gym program for self-management at home    Status New    Target Date 01/31/21      PT LONG TERM GOAL #2   Title Patient to demonstrate appropriate posture and body mechanics needed for daily activities    Status New    Target Date 01/31/21      PT LONG TERM GOAL #3   Title Patient to improve lumbar AROM to WFL/WNL without pain provocation    Status New    Target Date 01/31/21      PT LONG TERM GOAL #4   Title Patient will demonstrate improved B LE strength to >/= 4+/5 for improved stability and ease of mobility    Status New    Target Date 01/31/21      PT LONG TERM GOAL #5   Title Patient to report ability to perform ADLs, household, and work-related tasks without limitation due to LBP, LOM or LE weakness    Status New    Target Date 01/31/21                  Plan - 12/20/20 1104    Clinical Impression Statement Leveda Anna is a 35 y/o female who presents to OP PT ~6 weeks s/p L4-5 lumbar laminectomy & microdiscectomy on 11/07/20 with ongoing stiffness and tightness in lumbar spine as well as L LE weakness limiting transitional mobility and activity tolerance. She reports pain has been pretty well controlled since surgery. Current deficits include decreased lumbar AROM in all planes, limited proximal LE flexibility and L>R LE weakness. Vone will benefit from skilled PT to address above deficits to reduce myofascial pain and tightness and improve core strength to restore normal mobility and allow for increased participation in desired activities.    Personal Factors and Comorbidities Time since onset of injury/illness/exacerbation;Past/Current Experience;Comorbidity 3+    Comorbidities L4-5 lumbar laminectomy & microdiscectomy 11/07/20, OA, DM, h/o C-section x 2    Examination-Activity Limitations Bed Mobility;Bend;Caring for  Others;Lift;Carry;Sit;Squat;Transfers;Toileting;Locomotion Level    Examination-Participation Restrictions Cleaning;Community Activity;Driving;Interpersonal Relationship;Shop;Yard Work    Stability/Clinical Decision Making Stable/Uncomplicated    Clinical Decision Making Low    Rehab Potential Good    PT Frequency 2x / week    PT Duration 6 weeks    PT Treatment/Interventions ADLs/Self Care Home Management;Cryotherapy;Electrical Stimulation;Iontophoresis 4mg /ml Dexamethasone;Moist Heat;Ultrasound;Gait training;Stair training;Functional mobility training;Therapeutic activities;Therapeutic exercise;Balance training;Neuromuscular re-education;Patient/family education;Manual techniques;Scar mobilization;Passive range of motion;Dry needling;Taping    PT Next Visit Plan Review initial HEP; posture and body mechanics education; gentle lumbar mobilization/ROM & strengthening; proximal LE flexibility/stretching    PT Home Exercise Plan Medbridge Access Code: ([email or web address])    Consulted and Agree with Plan of Care Patient           Patient will benefit from skilled therapeutic intervention in order to improve the following deficits and impairments:  Abnormal gait,Decreased activity tolerance,Decreased knowledge of precautions,Decreased mobility,Decreased range of motion,Decreased strength,Difficulty walking,Hypomobility,Increased fascial restricitons,Increased muscle spasms,Impaired perceived functional ability,Impaired flexibility,Postural dysfunction,Improper body mechanics,Pain  Visit Diagnosis: Radiculopathy, lumbar region  Muscle weakness (generalized)  Other symptoms and signs involving the musculoskeletal system     Problem List Patient Active Problem List   Diagnosis Date Noted  . Left-sided weakness 11/07/2020  . S/P lumbar  laminectomy 11/07/2020  . Hemorrhoids, internal, with bleeding & occ prolapse 03/20/2014  . Constipation, chronic 03/20/2014    Marry GuanJoAnne M Rae Plotner, PT,  MPT 12/20/2020, 12:48 PM  [facility] [address]  [address] [place], [place], [address] Phone: [phone]   Fax:  [phone]  Name: Desmond DikeSaovivone Noy MRN: [id number] Date of Birth: 10/11/1986

## 2020-12-20 NOTE — Patient Instructions (Signed)
      Access Code: [email or web address] URL: [email or web address] Date: 12/20/2020 Prepared by: Glenetta Hew  Exercises Supine Pelvic Tilt - 2 x daily - 7 x weekly - 2 sets - 10 reps - 5 sec hold Supine Lower Trunk Rotation - 2 x daily - 7 x weekly - 5 reps - 10 sec hold Hooklying Hamstring Stretch with Strap - 2 x daily - 7 x weekly - 3 reps - 30 sec hold Supine ITB Stretch with Strap - 2 x daily - 7 x weekly - 3 reps - 30 sec hold Supine Piriformis Stretch with Foot on Ground - 2 x daily - 7 x weekly - 3 reps - 30 sec hold Supine Quadriceps Stretch with Strap on Table - 2 x daily - 7 x weekly - 3 reps - 30 sec hold

## 2020-12-26 ENCOUNTER — Other Ambulatory Visit: Payer: Self-pay

## 2020-12-26 ENCOUNTER — Ambulatory Visit: Payer: 59

## 2020-12-26 DIAGNOSIS — R29898 Other symptoms and signs involving the musculoskeletal system: Secondary | ICD-10-CM

## 2020-12-26 DIAGNOSIS — M6281 Muscle weakness (generalized): Secondary | ICD-10-CM

## 2020-12-26 DIAGNOSIS — M5416 Radiculopathy, lumbar region: Secondary | ICD-10-CM

## 2020-12-26 NOTE — Therapy (Signed)
[facility] [address]  [address] [place], [place], [address] Phone: [phone]   Fax:  [phone]  Physical Therapy Treatment  Patient Details  Name: Veronica Cannon MRN: [id number] Date of Birth: [birth year] Referring Provider (PT): Tia Alert, MD   Encounter Date: 12/26/2020   PT End of Session - 12/26/20 1151    Visit Number 2    Date for PT Re-Evaluation 01/31/21    Authorization Type UHC - VL:60 (PT/OT/ST)    PT Start Time 1103    PT Stop Time 1146    PT Time Calculation (min) 43 min    Activity Tolerance Patient tolerated treatment well    Behavior During Therapy [facility] for tasks assessed/performed           Past Medical History:  Diagnosis Date  . Gestational diabetes mellitus, antepartum   . Pre-eclampsia, severe, delivered 01/30/2014    Past Surgical History:  Procedure Laterality Date  . CESAREAN SECTION    . CESAREAN SECTION N/A 01/29/2014   Procedure: Repeat Cesarean Section Delivery Baby Girl  @ 2112,  Apgars 8/9;  Surgeon: Essie Hart, MD;  Location: [facility];  Service: Obstetrics;  Laterality: N/A;  . COLONOSCOPY N/A 10/13/2018   Procedure: COLONOSCOPY;  Surgeon: Romie Levee, MD;  Location: [facility];  Service: Endoscopy;  Laterality: N/A;    There were no vitals filed for this visit.   Subjective Assessment - 12/26/20 1104    Subjective Pt reports she is feeling stiff today, mostly when standing.    Pertinent History 11/07/20 - L4-5 microdiscectomy with return to surgery for post-op L LE weakness/numbness    Patient Stated Goals "to be my normal self " (feel like she can move around w/o the stiffness)    Currently in Pain? No/denies                             [facility] Adult PT Treatment/Exercise - 12/26/20 0001      Exercises   Exercises Knee/Hip;Lumbar      Lumbar Exercises: Stretches   Lobbyist Right;Left;30 seconds    Quad Stretch Limitations supine with  strap    Piriformis Stretch Right;Left;30 seconds    Piriformis Stretch Limitations KTOS supine    Other Lumbar Stretch Exercise child pose 30 sec      Lumbar Exercises: Aerobic   Nustep L1x36min      Lumbar Exercises: Supine   Pelvic Tilt 10 reps    Pelvic Tilt Limitations cues for technique    Clam 10 reps;5 seconds    Clam Limitations Rtband    Bridge 20 reps    Bridge Limitations Rtband with arms crossed    Bridge with March 10 reps    Bridge with [employer] Limitations Rtband    Other Supine Lumbar Exercises pedals with TrA activation      Lumbar Exercises: Sidelying   Clam --    Clam Limitations --      Knee/Hip Exercises: Stretches   Passive Hamstring Stretch Both;2 reps;30 seconds    Passive Hamstring Stretch Limitations supine with strap    ITB Stretch Both;30 seconds    ITB Stretch Limitations supine with strap                    PT Short Term Goals - 12/26/20 1158      PT SHORT TERM GOAL #1   Title Patient will be independent  with initial HEP    Status On-going    Target Date 01/03/21      PT SHORT TERM GOAL #2   Title Patient will verbalize/demonstrate understanding of neutral spine posture and proper body mechanics to reduce strain on lumbar spine    Status On-going    Target Date 01/10/21             PT Long Term Goals - 12/26/20 1158      PT LONG TERM GOAL #1   Title Patient will be independent with ongoing/advanced HEP +/- gym program for self-management at home    Status On-going      PT LONG TERM GOAL #2   Title Patient to demonstrate appropriate posture and body mechanics needed for daily activities    Status On-going      PT LONG TERM GOAL #3   Title Patient to improve lumbar AROM to WFL/WNL without pain provocation    Status On-going      PT LONG TERM GOAL #4   Title Patient will demonstrate improved B LE strength to >/= 4+/5 for improved stability and ease of mobility    Status On-going      PT LONG TERM GOAL #5    Title Patient to report ability to perform ADLs, household, and work-related tasks without limitation due to LBP, LOM or LE weakness    Status On-going                 Plan - 12/26/20 1151    Clinical Impression Statement Pt responded well to treatment, did note that she has not had a chance to do home exercises since evalution, educated pt on spacing exercises throughtout the day and importance of participating in HEP. Reviewed HEP with pt with demonstration and cueing given to ensure proper performance. Pt had no complaints during treatment other than stiffness in her back. Pt has good core strength but stated that standing increases symptoms mostly when completing house activities. Educated pt on outlook for PT, disease process, and plan moving foward.    Personal Factors and Comorbidities Time since onset of injury/illness/exacerbation;Past/Current Experience;Comorbidity 3+    Comorbidities L4-5 lumbar laminectomy & microdiscectomy 11/07/20, OA, DM, h/o C-section x 2    PT Frequency 2x / week    PT Duration 6 weeks    PT Treatment/Interventions ADLs/Self Care Home Management;Cryotherapy;Electrical Stimulation;Iontophoresis 4mg /ml Dexamethasone;Moist Heat;Ultrasound;Gait training;Stair training;Functional mobility training;Therapeutic activities;Therapeutic exercise;Balance training;Neuromuscular re-education;Patient/family education;Manual techniques;Scar mobilization;Passive range of motion;Dry needling;Taping    PT Next Visit Plan posture and body mechanics education; gentle lumbar mobilization/ROM & strengthening; proximal LE flexibility/stretching    PT Home Exercise Plan Medbridge Access Code: ([email or web address])    Consulted and Agree with Plan of Care Patient           Patient will benefit from skilled therapeutic intervention in order to improve the following deficits and impairments:  Abnormal gait,Decreased activity tolerance,Decreased knowledge of precautions,Decreased  mobility,Decreased range of motion,Decreased strength,Difficulty walking,Hypomobility,Increased fascial restricitons,Increased muscle spasms,Impaired perceived functional ability,Impaired flexibility,Postural dysfunction,Improper body mechanics,Pain  Visit Diagnosis: Radiculopathy, lumbar region  Muscle weakness (generalized)  Other symptoms and signs involving the musculoskeletal system     Problem List Patient Active Problem List   Diagnosis Date Noted  . Left-sided weakness 11/07/2020  . S/P lumbar laminectomy 11/07/2020  . Hemorrhoids, internal, with bleeding & occ prolapse 03/20/2014  . Constipation, chronic 03/20/2014    03/22/2014, PTA 12/26/2020, 12:03 PM  [facility] [address]  [place] [address]  [address] [place], [place], [address] Phone: [phone]   Fax:  [phone]  Name: Veronica Cannon MRN: [id number] Date of Birth: 10/03/1986

## 2021-01-01 ENCOUNTER — Ambulatory Visit: Payer: 59 | Attending: Neurological Surgery | Admitting: Physical Therapy

## 2021-01-01 ENCOUNTER — Encounter: Payer: Self-pay | Admitting: Physical Therapy

## 2021-01-01 ENCOUNTER — Other Ambulatory Visit: Payer: Self-pay

## 2021-01-01 DIAGNOSIS — M5416 Radiculopathy, lumbar region: Secondary | ICD-10-CM | POA: Insufficient documentation

## 2021-01-01 DIAGNOSIS — R29898 Other symptoms and signs involving the musculoskeletal system: Secondary | ICD-10-CM | POA: Insufficient documentation

## 2021-01-01 DIAGNOSIS — M6281 Muscle weakness (generalized): Secondary | ICD-10-CM | POA: Insufficient documentation

## 2021-01-01 NOTE — Therapy (Signed)
[facility] [address]  [address] [place], [place], [address] Phone: [phone]   Fax:  [phone]  Physical Therapy Treatment  Patient Details  Name: Veronica Cannon MRN: [id number] Date of Birth: [birth year] Referring Provider (PT): Tia Alert, MD   Encounter Date: 01/01/2021   PT End of Session - 01/01/21 1353    Visit Number 3    Date for PT Re-Evaluation 01/31/21    Authorization Type UHC - VL:60 (PT/OT/ST)    PT Start Time 1310    PT Stop Time 1354    PT Time Calculation (min) 44 min    Activity Tolerance Patient tolerated treatment well    Behavior During Therapy [facility] for tasks assessed/performed           Past Medical History:  Diagnosis Date  . Gestational diabetes mellitus, antepartum   . Pre-eclampsia, severe, delivered 01/30/2014    Past Surgical History:  Procedure Laterality Date  . CESAREAN SECTION    . CESAREAN SECTION N/A 01/29/2014   Procedure: Repeat Cesarean Section Delivery Baby Girl  @ 2112,  Apgars 8/9;  Surgeon: Essie Hart, MD;  Location: [facility];  Service: Obstetrics;  Laterality: N/A;  . COLONOSCOPY N/A 10/13/2018   Procedure: COLONOSCOPY;  Surgeon: Romie Levee, MD;  Location: [facility];  Service: Endoscopy;  Laterality: N/A;    There were no vitals filed for this visit.   Subjective Assessment - 01/01/21 1314    Subjective Fatigue after last session. Still have some stiffness    Pertinent History 11/07/20 - L4-5 microdiscectomy with return to surgery for post-op L LE weakness/numbness    Limitations Sitting;Lifting    How long can you sit comfortably? 30 minutes    Patient Stated Goals "to be my normal self " (feel like she can move around w/o the stiffness)    Currently in Pain? No/denies                             [facility] Adult PT Treatment/Exercise - 01/01/21 0001      Lumbar Exercises: Stretches   Passive Hamstring Stretch Right;Left;4 reps;10  seconds    Single Knee to Chest Stretch Right;Left;3 reps;10 seconds    Piriformis Stretch Right;Left;30 seconds      Lumbar Exercises: Aerobic   Nustep L4 x 6 min      Lumbar Exercises: Standing   Shoulder Extension Theraband;20 reps;Strengthening;Both    Theraband Level (Shoulder Extension) Level 3 (Green)      Lumbar Exercises: Seated   Sit to Stand 5 reps   x2   Other Seated Lumbar Exercises Rows green Tband 2x10      Lumbar Exercises: Supine   Ab Set 2 seconds;15 reps   With pball   Bridge Compliant;20 reps;2 seconds    Straight Leg Raise 10 reps;2 seconds      Knee/Hip Exercises: Standing   Hip Abduction AROM;Stengthening;1 set;10 reps;Knee straight    Hip Extension Both;1 set;10 reps;Knee straight;AROM      Knee/Hip Exercises: Seated   Hamstring Curl 2 sets;10 reps;Both    Hamstring Limitations Elizabeth Palau                    PT Short Term Goals - 12/26/20 1158      PT SHORT TERM GOAL #1   Title Patient will be independent with initial HEP    Status On-going    Target Date 01/03/21  PT SHORT TERM GOAL #2   Title Patient will verbalize/demonstrate understanding of neutral spine posture and proper body mechanics to reduce strain on lumbar spine    Status On-going    Target Date 01/10/21             PT Long Term Goals - 12/26/20 1158      PT LONG TERM GOAL #1   Title Patient will be independent with ongoing/advanced HEP +/- gym program for self-management at home    Status On-going      PT LONG TERM GOAL #2   Title Patient to demonstrate appropriate posture and body mechanics needed for daily activities    Status On-going      PT LONG TERM GOAL #3   Title Patient to improve lumbar AROM to WFL/WNL without pain provocation    Status On-going      PT LONG TERM GOAL #4   Title Patient will demonstrate improved B LE strength to >/= 4+/5 for improved stability and ease of mobility    Status On-going      PT LONG TERM GOAL #5   Title Patient  to report ability to perform ADLs, household, and work-related tasks without limitation due to LBP, LOM or LE weakness    Status On-going                 Plan - 01/01/21 1355    Clinical Impression Statement Pt tolerated a progressed treatment well evident by no subjective reports of increase pain. Pt has a maintains a rigid posture with mobility. Cues provided for anterior wt shift with sit to stands. Postural cues provided during seated rows and standing shoulder extensions. Cues given for core engagement with supine bridges. Towards end of session pt was able to complete a few reps of sit to stands without UE use and proper anterior wt shift.    Personal Factors and Comorbidities Time since onset of injury/illness/exacerbation;Past/Current Experience;Comorbidity 3+    Comorbidities L4-5 lumbar laminectomy & microdiscectomy 11/07/20, OA, DM, h/o C-section x 2    Examination-Activity Limitations Bed Mobility;Bend;Caring for Others;Lift;Carry;Sit;Squat;Transfers;Toileting;Locomotion Level    Examination-Participation Restrictions Cleaning;Community Activity;Driving;Interpersonal Relationship;Shop;Yard Work    Stability/Clinical Decision Making Stable/Uncomplicated    Rehab Potential Good    PT Frequency 2x / week    PT Duration 6 weeks    PT Treatment/Interventions ADLs/Self Care Home Management;Cryotherapy;Electrical Stimulation;Iontophoresis 4mg /ml Dexamethasone;Moist Heat;Ultrasound;Gait training;Stair training;Functional mobility training;Therapeutic activities;Therapeutic exercise;Balance training;Neuromuscular re-education;Patient/family education;Manual techniques;Scar mobilization;Passive range of motion;Dry needling;Taping    PT Next Visit Plan posture and body mechanics education; gentle lumbar mobilization/ROM & strengthening; proximal LE flexibility/stretching           Patient will benefit from skilled therapeutic intervention in order to improve the following deficits and  impairments:  Abnormal gait,Decreased activity tolerance,Decreased knowledge of precautions,Decreased mobility,Decreased range of motion,Decreased strength,Difficulty walking,Hypomobility,Increased fascial restricitons,Increased muscle spasms,Impaired perceived functional ability,Impaired flexibility,Postural dysfunction,Improper body mechanics,Pain  Visit Diagnosis: Radiculopathy, lumbar region  Other symptoms and signs involving the musculoskeletal system  Muscle weakness (generalized)     Problem List Patient Active Problem List   Diagnosis Date Noted  . Left-sided weakness 11/07/2020  . S/P lumbar laminectomy 11/07/2020  . Hemorrhoids, internal, with bleeding & occ prolapse 03/20/2014  . Constipation, chronic 03/20/2014    03/22/2014, PTA 01/01/2021, 2:00 PM  [facility] [address]  [address] [place], [place], [place] Phone: [phone]   Fax:  [phone]  Name: Lory Nowaczyk MRN: Desmond Dike  Date of Birth: January 19, 1986

## 2021-01-04 ENCOUNTER — Other Ambulatory Visit: Payer: Self-pay

## 2021-01-04 ENCOUNTER — Ambulatory Visit: Payer: 59

## 2021-01-04 DIAGNOSIS — R29898 Other symptoms and signs involving the musculoskeletal system: Secondary | ICD-10-CM

## 2021-01-04 DIAGNOSIS — M6281 Muscle weakness (generalized): Secondary | ICD-10-CM

## 2021-01-04 DIAGNOSIS — M5416 Radiculopathy, lumbar region: Secondary | ICD-10-CM

## 2021-01-04 NOTE — Therapy (Signed)
[facility] [address]  [place], [place], [address] Phone: [phone]   Fax:  [phone]  Physical Therapy Treatment  Patient Details  Name: Veronica Cannon MRN: [id number] Date of Birth: [birth year] Referring Provider (PT): Eustace Moore, MD   Encounter Date: 01/04/2021   PT End of Session - 01/04/21 1151    Visit Number 4    Date for PT Re-Evaluation 01/31/21    Authorization Type UHC - VL:60 (PT/OT/ST)    PT Start Time 1105    PT Stop Time 1145    PT Time Calculation (min) 40 min    Activity Tolerance Patient tolerated treatment well    Behavior During Therapy [facility] for tasks assessed/performed           Past Medical History:  Diagnosis Date  . Gestational diabetes mellitus, antepartum   . Pre-eclampsia, severe, delivered 01/30/2014    Past Surgical History:  Procedure Laterality Date  . CESAREAN SECTION    . CESAREAN SECTION N/A 01/29/2014   Procedure: Repeat Cesarean Section Delivery Baby Girl  @ 2112,  Apgars 8/9;  Surgeon: Sanjuana Kava, MD;  Location: [facility];  Service: Obstetrics;  Laterality: N/A;  . COLONOSCOPY N/A 10/13/2018   Procedure: COLONOSCOPY;  Surgeon: Leighton Ruff, MD;  Location: [facility];  Service: Endoscopy;  Laterality: N/A;    There were no vitals filed for this visit.   Subjective Assessment - 01/04/21 1106    Subjective Pt reports no pain just stiffness.    Pertinent History 11/07/20 - L4-5 microdiscectomy with return to surgery for post-op L LE weakness/numbness    Patient Stated Goals "to be my normal self " (feel like she can move around w/o the stiffness)    Currently in Pain? No/denies              [facility] PT Assessment - 01/04/21 0001      AROM   Lumbar Flexion hands below knees; above ankles    Lumbar Extension still limited 50%    Lumbar - Right Side Bend hand to knee jt line    Lumbar - Left Side Bend hand to knee jt line    Lumbar - Right Rotation 75%     Lumbar - Left Rotation 75%      Strength   Right Hip Flexion 4+/5    Right Hip Extension 4/5    Right Hip External Rotation  4/5    Right Hip Internal Rotation 4+/5    Right Hip ABduction 4/5    Right Hip ADduction 4+/5    Left Hip Flexion 4/5    Left Hip Extension 4-/5    Left Hip External Rotation 3+/5    Left Hip Internal Rotation 3+/5    Left Hip ABduction 4-/5    Left Hip ADduction 4-/5    Right Knee Flexion 4/5    Right Knee Extension 5/5    Left Knee Flexion 4-/5    Left Knee Extension 4+/5                         [facility] Adult PT Treatment/Exercise - 01/04/21 0001      Exercises   Exercises Knee/Hip;Lumbar      Lumbar Exercises: Stretches   Active Hamstring Stretch Right;Left;1 rep;30 seconds    Active Hamstring Stretch Limitations seated    Piriformis Stretch Right;Left;30 seconds   seated     Lumbar Exercises: Aerobic   Nustep L3x65mn  Lumbar Exercises: Standing   Other Standing Lumbar Exercises ball 3 way shld flexion 10x each      Lumbar Exercises: Seated   Sit to Stand Limitations   3x10   Sit to Stand Limitations medicine ball      Lumbar Exercises: Supine   Bridge Compliant;20 reps;2 seconds    Bridge Limitations Rtband with arms crossed                    PT Short Term Goals - 01/04/21 1121      PT SHORT TERM GOAL #1   Title Patient will be independent with initial HEP    Status Achieved    Target Date 01/03/21      PT SHORT TERM GOAL #2   Title Patient will verbalize/demonstrate understanding of neutral spine posture and proper body mechanics to reduce strain on lumbar spine    Status Partially Met    Target Date 01/10/21             PT Long Term Goals - 01/04/21 1126      PT LONG TERM GOAL #1   Title Patient will be independent with ongoing/advanced HEP +/- gym program for self-management at home    Status Partially Met      PT LONG TERM GOAL #2   Title Patient to demonstrate appropriate posture and  body mechanics needed for daily activities    Status Partially Met      PT LONG TERM GOAL #3   Title Patient to improve lumbar AROM to WFL/WNL without pain provocation    Status Partially Met      PT LONG TERM GOAL #4   Title Patient will demonstrate improved B LE strength to >/= 4+/5 for improved stability and ease of mobility    Status On-going      PT LONG TERM GOAL #5   Title Patient to report ability to perform ADLs, household, and work-related tasks without limitation due to LBP, LOM or LE weakness    Status Partially Met                 Plan - 01/04/21 1151    Clinical Impression Statement Pt responded well to treatment w/o any reports of pain. Her B LE strength is better but still has most weakness in knee flexors, hip extensors, abductors, and rotators. She does report that she can move around good enough w/o pain but carrying items still seems to be a problem for her. Her lumbar ROM was good, she was still limited mostly with extension and B side bending. Educated her on positons laying and sitting to keep good posture and reduce the amount of stress being put on the spine. She does note she is getting stronger and thinks that the exercises are helping.  Did some postural exercises and lumbopelvic exercises with cueing given to prevent substitution movements. She is independent with her initial HEP so has met STG 1, progress is being made toward all goals.    Personal Factors and Comorbidities Time since onset of injury/illness/exacerbation;Past/Current Experience;Comorbidity 3+    Comorbidities L4-5 lumbar laminectomy & microdiscectomy 11/07/20, OA, DM, h/o C-section x 2    PT Frequency 2x / week    PT Duration 6 weeks    PT Treatment/Interventions ADLs/Self Care Home Management;Cryotherapy;Electrical Stimulation;Iontophoresis 110m/ml Dexamethasone;Moist Heat;Ultrasound;Gait training;Stair training;Functional mobility training;Therapeutic activities;Therapeutic exercise;Balance  training;Neuromuscular re-education;Patient/family education;Manual techniques;Scar mobilization;Passive range of motion;Dry needling;Taping    PT Next Visit Plan posture and body mechanics  education; gentle lumbar mobilization/ROM & strengthening; proximal LE flexibility/stretching    Consulted and Agree with Plan of Care Patient           Patient will benefit from skilled therapeutic intervention in order to improve the following deficits and impairments:  Abnormal gait,Decreased activity tolerance,Decreased knowledge of precautions,Decreased mobility,Decreased range of motion,Decreased strength,Difficulty walking,Hypomobility,Increased fascial restricitons,Increased muscle spasms,Impaired perceived functional ability,Impaired flexibility,Postural dysfunction,Improper body mechanics,Pain  Visit Diagnosis: Radiculopathy, lumbar region  Other symptoms and signs involving the musculoskeletal system  Muscle weakness (generalized)     Problem List Patient Active Problem List   Diagnosis Date Noted  . Left-sided weakness 11/07/2020  . S/P lumbar laminectomy 11/07/2020  . Hemorrhoids, internal, with bleeding & occ prolapse 03/20/2014  . Constipation, chronic 03/20/2014    Artist Pais, PTA 01/04/2021, 12:02 PM  [facility] [address]  [place], [place], [address] Phone: [phone]   Fax:  [phone]  Name: Veronica Cannon MRN: [id number] Date of Birth: 10-05-1986

## 2021-01-08 ENCOUNTER — Ambulatory Visit: Payer: 59

## 2021-01-08 ENCOUNTER — Other Ambulatory Visit: Payer: Self-pay

## 2021-01-08 DIAGNOSIS — M5416 Radiculopathy, lumbar region: Secondary | ICD-10-CM

## 2021-01-08 DIAGNOSIS — M6281 Muscle weakness (generalized): Secondary | ICD-10-CM

## 2021-01-08 DIAGNOSIS — R29898 Other symptoms and signs involving the musculoskeletal system: Secondary | ICD-10-CM

## 2021-01-08 NOTE — Therapy (Signed)
[facility] [address]  [place], [place], [address] Phone: [phone]   Fax:  [phone]  Physical Therapy Treatment  Patient Details  Name: Veronica Cannon MRN: [id number] Date of Birth: 11/21/1985 Referring Provider (PT): Eustace Moore, MD   Encounter Date: 01/08/2021   PT End of Session - 01/08/21 1015    Visit Number 5    Date for PT Re-Evaluation 01/31/21    Authorization Type UHC - VL:60 (PT/OT/ST)    PT Start Time 0935    PT Stop Time 1014    PT Time Calculation (min) 39 min    Activity Tolerance Patient tolerated treatment well    Behavior During Therapy [facility] for tasks assessed/performed           Past Medical History:  Diagnosis Date  . Gestational diabetes mellitus, antepartum   . Pre-eclampsia, severe, delivered 01/30/2014    Past Surgical History:  Procedure Laterality Date  . CESAREAN SECTION    . CESAREAN SECTION N/A 01/29/2014   Procedure: Repeat Cesarean Section Delivery Baby Girl  @ 2112,  Apgars 8/9;  Surgeon: Sanjuana Kava, MD;  Location: [facility];  Service: Obstetrics;  Laterality: N/A;  . COLONOSCOPY N/A 10/13/2018   Procedure: COLONOSCOPY;  Surgeon: Leighton Ruff, MD;  Location: [facility];  Service: Endoscopy;  Laterality: N/A;    There were no vitals filed for this visit.   Subjective Assessment - 01/08/21 0936    Subjective Pt reports last session went ok, no pain today just the stiffness.    Pertinent History 11/07/20 - L4-5 microdiscectomy with return to surgery for post-op L LE weakness/numbness    Patient Stated Goals "to be my normal self " (feel like she can move around w/o the stiffness)    Currently in Pain? No/denies                             [facility] Adult PT Treatment/Exercise - 01/08/21 0001      Exercises   Exercises Knee/Hip;Lumbar;Shoulder      Lumbar Exercises: Seated   Sit to Stand Limitations    Sit to Stand Limitations B 5# weight;  2x10      Lumbar Exercises: Supine   Bridge Compliant;20 reps;Limitations    Bridge Limitations straight leg on physioball; 10 reps with arms crossed    Straight Leg Raise 10 reps;3 seconds    Other Supine Lumbar Exercises bridges with march and side step 10 reps      Knee/Hip Exercises: Stretches   Active Hamstring Stretch Right;Left;30 seconds    Active Hamstring Stretch Limitations seated    Piriformis Stretch Right;Left;30 seconds    Piriformis Stretch Limitations seated    Other Knee/Hip Stretches glute stretch on step B 30 sec each      Knee/Hip Exercises: Machines for Strengthening   Cybex Leg Press 25# 2x10      Shoulder Exercises: ROM/Strengthening   Lat Pull --   20# 2x10   Cybex Row --   20# 2x10                   PT Short Term Goals - 01/04/21 1121      PT SHORT TERM GOAL #1   Title Patient will be independent with initial HEP    Status Achieved    Target Date 01/03/21      PT SHORT TERM GOAL #2   Title Patient will  verbalize/demonstrate understanding of neutral spine posture and proper body mechanics to reduce strain on lumbar spine    Status Partially Met    Target Date 01/10/21             PT Long Term Goals - 01/04/21 1126      PT LONG TERM GOAL #1   Title Patient will be independent with ongoing/advanced HEP +/- gym program for self-management at home    Status Partially Met      PT LONG TERM GOAL #2   Title Patient to demonstrate appropriate posture and body mechanics needed for daily activities    Status Partially Met      PT LONG TERM GOAL #3   Title Patient to improve lumbar AROM to WFL/WNL without pain provocation    Status Partially Met      PT LONG TERM GOAL #4   Title Patient will demonstrate improved B LE strength to >/= 4+/5 for improved stability and ease of mobility    Status On-going      PT LONG TERM GOAL #5   Title Patient to report ability to perform ADLs, household, and work-related tasks without limitation due to  LBP, LOM or LE weakness    Status Partially Met                 Plan - 01/08/21 1016    Clinical Impression Statement Pt cont to respond well to progressed exercises, she notes that she is able to move better at home and feels like she is getting better overall. Cueing was given to engage core muscles for stability throughout session. Cues also given to prevent any compensatory strategies and to isolate the targeted muscles. She had no complaints of pain or discomfort during the session. Some fatigue was noted towards the end with compensation noted but paced exercises to allow for recovery.    Personal Factors and Comorbidities Time since onset of injury/illness/exacerbation;Past/Current Experience;Comorbidity 3+    Comorbidities L4-5 lumbar laminectomy & microdiscectomy 11/07/20, OA, DM, h/o C-section x 2    PT Frequency 2x / week    PT Duration 6 weeks    PT Treatment/Interventions ADLs/Self Care Home Management;Cryotherapy;Electrical Stimulation;Iontophoresis 58m/ml Dexamethasone;Moist Heat;Ultrasound;Gait training;Stair training;Functional mobility training;Therapeutic activities;Therapeutic exercise;Balance training;Neuromuscular re-education;Patient/family education;Manual techniques;Scar mobilization;Passive range of motion;Dry needling;Taping    PT Next Visit Plan posture and body mechanics education; gentle lumbar mobilization/ROM & strengthening; proximal LE flexibility/stretching    PT Home Exercise Plan Medbridge Access Code: [email or web address](2/[email or web address])    Consulted and Agree with Plan of Care Patient           Patient will benefit from skilled therapeutic intervention in order to improve the following deficits and impairments:  Abnormal gait,Decreased activity tolerance,Decreased knowledge of precautions,Decreased mobility,Decreased range of motion,Decreased strength,Difficulty walking,Hypomobility,Increased fascial restricitons,Increased muscle spasms,Impaired perceived functional  ability,Impaired flexibility,Postural dysfunction,Improper body mechanics,Pain  Visit Diagnosis: Radiculopathy, lumbar region  Other symptoms and signs involving the musculoskeletal system  Muscle weakness (generalized)     Problem List Patient Active Problem List   Diagnosis Date Noted  . Left-sided weakness 11/07/2020  . S/P lumbar laminectomy 11/07/2020  . Hemorrhoids, internal, with bleeding & occ prolapse 03/20/2014  . Constipation, chronic 03/20/2014    BArtist Pais PTA 01/08/2021, 11:14 AM  [facility] [place] [address]Phone: [phone]  Fax:  [phone] Name: Veronica ShresthaMRN: [id number] of Birth: 1Jan 18, 1987

## 2021-01-11 ENCOUNTER — Other Ambulatory Visit: Payer: Self-pay

## 2021-01-11 ENCOUNTER — Ambulatory Visit: Payer: 59 | Admitting: Physical Therapy

## 2021-01-11 ENCOUNTER — Encounter: Payer: Self-pay | Admitting: Physical Therapy

## 2021-01-11 DIAGNOSIS — M6281 Muscle weakness (generalized): Secondary | ICD-10-CM

## 2021-01-11 DIAGNOSIS — M5416 Radiculopathy, lumbar region: Secondary | ICD-10-CM | POA: Diagnosis not present

## 2021-01-11 DIAGNOSIS — R29898 Other symptoms and signs involving the musculoskeletal system: Secondary | ICD-10-CM

## 2021-01-11 NOTE — Patient Instructions (Signed)
Access Code: [email or web address] URL: [email or web address] Date: 01/11/2021 Prepared by: Glenetta Hew  Exercises Cat-Camel - 2 x daily - 7 x weekly - 2 sets - 10 reps - 5 sec hold Bird Dog - 1 x daily - 7 x weekly - 2 sets - 10 reps - 5 sec hold Bridge with Hip Abduction and Resistance - 1 x daily - 7 x weekly - 2 sets - 10 reps - 5 sec hold Clamshell with Resistance - 1 x daily - 7 x weekly - 2 sets - 10 reps - 3-5 sec hold Sidelying Reverse Clamshell with Resistance - 1 x daily - 7 x weekly - 2 sets - 10 reps - 3-5 sec hold  Patient Education Posture and Body Mechanics   Sleeping on Back  Place pillow under knees. A pillow with cervical support and a roll around waist are also helpful. Copyright  VHI. All rights reserved.  Sleeping on Side Place pillow between knees. Use cervical support under neck and a roll around waist as needed. Copyright  VHI. All rights reserved.   Sleeping on Stomach   If this is the only desirable sleeping position, place pillow under lower legs, and under stomach or chest as needed.  Posture - Sitting   Sit upright, head facing forward. Try using a roll to support lower back. Keep shoulders relaxed, and avoid rounded back. Keep hips level with knees. Avoid crossing legs for long periods. Stand to Sit / Sit to Stand   To sit: Bend knees to lower self onto front edge of chair, then scoot back on seat. To stand: Reverse sequence by placing one foot forward, and scoot to front of seat. Use rocking motion to stand up.   Work Height and Reach  Ideal work height is no more than 2 to 4 inches below elbow level when standing, and at elbow level when sitting. Reaching should be limited to arm's length, with elbows slightly bent.  Bending  Bend at hips and knees, not back. Keep feet shoulder-width apart.    Posture - Standing   Good posture is important. Avoid slouching and forward head thrust. Maintain curve in low back and align ears  over shoul- ders, hips over ankles.  Alternating Positions   Alternate tasks and change positions frequently to reduce fatigue and muscle tension. Take rest breaks. Computer Work   Position work to Art gallery manager. Use proper work and seat height. Keep shoulders back and down, wrists straight, and elbows at right angles. Use chair that provides full back support. Add footrest and lumbar roll as needed.  Getting Into / Out of Car  Lower self onto seat, scoot back, then bring in one leg at a time. Reverse sequence to get out.  Dressing  Lie on back to pull socks or slacks over feet, or sit and bend leg while keeping back straight.    Housework - Sink  Place one foot on ledge of cabinet under sink when standing at sink for prolonged periods.   Pushing / Pulling  Pushing is preferable to pulling. Keep back in proper alignment, and use leg muscles to do the work.  Deep Squat   Squat and lift with both arms held against upper trunk. Tighten stomach muscles without holding breath. Use smooth movements to avoid jerking.  Avoid Twisting   Avoid twisting or bending back. Pivot around using foot movements, and bend at knees if needed when reaching for articles.  Carrying Luggage   Distribute weight  evenly on both sides. Use a cart whenever possible. Do not twist trunk. Move body as a unit.   Lifting Principles .Maintain proper posture and head alignment. .Slide object as close as possible before lifting. .Move obstacles out of the way. .Test before lifting; ask for help if too heavy. .Tighten stomach muscles without holding breath. .Use smooth movements; do not jerk. .Use legs to do the work, and pivot with feet. .Distribute the work load symmetrically and close to the center of trunk. .Push instead of pull whenever possible.   Ask For Help   Ask for help and delegate to others when possible. Coordinate your movements when lifting together, and maintain the low back  curve.  Log Roll   Lying on back, bend left knee and place left arm across chest. Roll all in one movement to the right. Reverse to roll to the left. Always move as one unit. Housework - Sweeping  Use long-handled equipment to avoid stooping.   Housework - Wiping  Position yourself as close as possible to reach work surface. Avoid straining your back.  Laundry - Unloading Wash   To unload small items at bottom of washer, lift leg opposite to arm being used to reach.  Gardening - Raking  Move close to area to be raked. Use arm movements to do the work. Keep back straight and avoid twisting.     Cart  When reaching into cart with one arm, lift opposite leg to keep back straight.   Getting Into / Out of Bed  Lower self to lie down on one side by raising legs and lowering head at the same time. Use arms to assist moving without twisting. Bend both knees to roll onto back if desired. To sit up, start from lying on side, and use same move-ments in reverse. Housework - Vacuuming  Hold the vacuum with arm held at side. Step back and forth to move it, keeping head up. Avoid twisting.   Laundry - Armed forces training and education officer so that bending and twisting can be avoided.   Laundry - Unloading Dryer  Squat down to reach into clothes dryer or use a reacher.  Gardening - Weeding / Psychiatric nurse or Kneel. Knee pads may be helpful.

## 2021-01-11 NOTE — Therapy (Signed)
[facility] [address]  [place], [place], [address] Phone: [phone]   Fax:  [phone]  Physical Therapy Treatment  Patient Details  Name: Veronica Cannon MRN: [id number] Date of Birth: 1986-03-24 Referring Provider (PT): Eustace Moore, MD   Encounter Date: 01/11/2021   PT End of Session - 01/11/21 1018    Visit Number 6    Date for PT Re-Evaluation 01/31/21    Authorization Type UHC - VL:60 (PT/OT/ST)    PT Start Time 1018    PT Stop Time 1104    PT Time Calculation (min) 46 min    Activity Tolerance Patient tolerated treatment well    Behavior During Therapy [facility] for tasks assessed/performed           Past Medical History:  Diagnosis Date  . Gestational diabetes mellitus, antepartum   . Pre-eclampsia, severe, delivered 01/30/2014    Past Surgical History:  Procedure Laterality Date  . CESAREAN SECTION    . CESAREAN SECTION N/A 01/29/2014   Procedure: Repeat Cesarean Section Delivery Baby Girl  @ 2112,  Apgars 8/9;  Surgeon: Sanjuana Kava, MD;  Location: [facility];  Service: Obstetrics;  Laterality: N/A;  . COLONOSCOPY N/A 10/13/2018   Procedure: COLONOSCOPY;  Surgeon: Leighton Ruff, MD;  Location: [facility];  Service: Endoscopy;  Laterality: N/A;    There were no vitals filed for this visit.   Subjective Assessment - 01/11/21 1023    Subjective Pt doing well. No concerns related to her back.    Pertinent History 11/07/20 - L4-5 microdiscectomy with return to surgery for post-op L LE weakness/numbness    Patient Stated Goals "to be my normal self " (feel like she can move around w/o the stiffness)    Currently in Pain? No/denies                             [facility] Adult PT Treatment/Exercise - 01/11/21 1018      Exercises   Exercises Lumbar      Lumbar Exercises: Aerobic   Recumbent Bike L2 x 6 min      Lumbar Exercises: Supine   Bridge with clamshell 10 reps;5 seconds     Bridge with [employer] Limitations green TB alt hip ABD/ER      Lumbar Exercises: Sidelying   Clam Right;Left;10 reps;3 seconds    Clam Limitations green TB    Other Sidelying Lumbar Exercises L/R reverse clam with green TB and ball btw knees 10 x 3"      Lumbar Exercises: Quadruped   Madcat/Old Horse 10 reps    Single Arm Raise Right;Left;5 reps;3 seconds    Straight Leg Raise 5 reps;3 seconds    Opposite Arm/Leg Raise 10 reps;3 seconds                  PT Education - 01/11/21 1026    Education Details HEP udpdate - Access Code: [email or web address] + Posture & body mechanics education    Person(s) Educated Patient    Methods Explanation;Demonstration;Verbal cues;Handout    Comprehension Verbalized understanding;Verbal cues required;Returned demonstration;Need further instruction            PT Short Term Goals - 01/11/21 1023      PT SHORT TERM GOAL #1   Title Patient will be independent with initial HEP    Status Achieved   01/04/21     PT SHORT TERM GOAL #2  Title Patient will verbalize/demonstrate understanding of neutral spine posture and proper body mechanics to reduce strain on lumbar spine    Status Achieved   01/11/21            PT Long Term Goals - 01/11/21 1039      PT LONG TERM GOAL #1   Title Patient will be independent with ongoing/advanced HEP +/- gym program for self-management at home    Status Partially Met    Target Date 01/31/21      PT LONG TERM GOAL #2   Title Patient to demonstrate appropriate posture and body mechanics needed for daily activities    Status Partially Met    Target Date 01/31/21      PT LONG TERM GOAL #3   Title Patient to improve lumbar AROM to WFL/WNL without pain provocation    Status Partially Met    Target Date 01/31/21      PT LONG TERM GOAL #4   Title Patient will demonstrate improved B LE strength to >/= 4+/5 for improved stability and ease of mobility    Status On-going    Target Date 01/31/21      PT LONG TERM  GOAL #5   Title Patient to report ability to perform ADLs, household, and work-related tasks without limitation due to LBP, LOM or LE weakness    Status Partially Met    Target Date 01/31/21                 Plan - 01/11/21 1026    Clinical Impression Statement Veronica Cannon continues to report no pain. She reports good awareness of posture relative to exercises but limited carryover into functional mobility and tasks, therefore provided education on proper posture and body mechanics with typical daily mobility and household tasks - pt acknowledging good understanding. She notes HEP going well with initial HEP stretches, but she feels ready to add some strengthening exercises into HEP, therefore introduced new exercises and progressed some of previously completed exercises targeting areas of weakness identified on most recent MMT with good tolerance and HEP updated accordingly.  All STGs now met. Veronica Cannon will continue to benefit from skilled PT for further lumbopelvic strengthening and stabilization to improve activity and reduce risk for future issues with her back.    Comorbidities L4-5 lumbar laminectomy & microdiscectomy 11/07/20, OA, DM, h/o C-section x 2    Rehab Potential Good    PT Frequency 2x / week    PT Duration 6 weeks    PT Treatment/Interventions ADLs/Self Care Home Management;Cryotherapy;Electrical Stimulation;Iontophoresis 33m/ml Dexamethasone;Moist Heat;Ultrasound;Gait training;Stair training;Functional mobility training;Therapeutic activities;Therapeutic exercise;Balance training;Neuromuscular re-education;Patient/family education;Manual techniques;Scar mobilization;Passive range of motion;Dry needling;Taping    PT Next Visit Plan gentle lumbar mobilization/ROM & strengthening; proximal LE flexibility/stretching; review posture and body mechanics education PRN    PT Home Exercise Plan Medbridge Access Code: [email or web address](2/17),  [email or web address] (3/11)    Consulted and Agree with Plan of Care Patient            Patient will benefit from skilled therapeutic intervention in order to improve the following deficits and impairments:  Abnormal gait,Decreased activity tolerance,Decreased knowledge of precautions,Decreased mobility,Decreased range of motion,Decreased strength,Difficulty walking,Hypomobility,Increased fascial restricitons,Increased muscle spasms,Impaired perceived functional ability,Impaired flexibility,Postural dysfunction,Improper body mechanics,Pain  Visit Diagnosis: Radiculopathy, lumbar region  Other symptoms and signs involving the musculoskeletal system  Muscle weakness (generalized)     Problem List Patient Active Problem List   Diagnosis Date Noted  . Left-sided weakness 11/07/2020  . S/P lumbar laminectomy  11/07/2020  . Hemorrhoids, internal, with bleeding & occ prolapse 03/20/2014  . Constipation, chronic 03/20/2014    Percival Spanish, PT, MPT 01/11/2021, 11:38 AM  [facility] [address]  [place], [place], [address] Phone: [phone]   Fax:  [phone]  Name: Veronica Cannon MRN: [id number] Date of Birth: [birth year]

## 2021-01-15 ENCOUNTER — Ambulatory Visit: Payer: 59 | Admitting: Physical Therapy

## 2021-01-15 ENCOUNTER — Other Ambulatory Visit: Payer: Self-pay

## 2021-01-15 ENCOUNTER — Encounter: Payer: Self-pay | Admitting: Physical Therapy

## 2021-01-15 DIAGNOSIS — M5416 Radiculopathy, lumbar region: Secondary | ICD-10-CM

## 2021-01-15 DIAGNOSIS — R29898 Other symptoms and signs involving the musculoskeletal system: Secondary | ICD-10-CM

## 2021-01-15 DIAGNOSIS — M6281 Muscle weakness (generalized): Secondary | ICD-10-CM

## 2021-01-15 NOTE — Patient Instructions (Signed)
  Access Code: [email or web address] URL: [email or web address] Date: 01/15/2021 Prepared by: Glenetta Hew  Exercises Seated Gentle Upper Trapezius Stretch - 2-3 x daily - 7 x weekly - 3 reps - 30 sec hold

## 2021-01-15 NOTE — Therapy (Addendum)
[facility] [address]  [place], [place], [address] Phone: [phone]   Fax:  [phone]  Physical Therapy Treatment / Progress Note / Discharge Summary  Patient Details  Name: Veronica Cannon MRN: [id number] Date of Birth: 07-19-1986 Referring Provider (PT): Eustace Moore, MD   Encounter Date: 01/15/2021   PT End of Session - 01/15/21 0932    Visit Number 7    Date for PT Re-Evaluation 01/31/21    Authorization Type [employer] - VL:60 (PT/OT/ST)    PT Start Time 0932    PT Stop Time 1004    PT Time Calculation (min) 32 min    Activity Tolerance Patient tolerated treatment well    Behavior During Therapy [facility] for tasks assessed/performed           Past Medical History:  Diagnosis Date  . Gestational diabetes mellitus, antepartum   . Pre-eclampsia, severe, delivered 01/30/2014    Past Surgical History:  Procedure Laterality Date  . CESAREAN SECTION    . CESAREAN SECTION N/A 01/29/2014   Procedure: Repeat Cesarean Section Delivery Baby Girl  @ 2112,  Apgars 8/9;  Surgeon: Sanjuana Kava, MD;  Location: [facility];  Service: Obstetrics;  Laterality: N/A;  . COLONOSCOPY N/A 10/13/2018   Procedure: COLONOSCOPY;  Surgeon: Leighton Ruff, MD;  Location: [facility];  Service: Endoscopy;  Laterality: N/A;    There were no vitals filed for this visit.   Subjective Assessment - 01/15/21 0934    Subjective Pt reports pain has not been a problem recently and denies any issues with daily tasks. Thinks she may be ready to transition to the HEP soon.    Pertinent History 11/07/20 - L4-5 microdiscectomy with return to surgery for post-op L LE weakness/numbness    How long can you sit comfortably? unlimited    Patient Stated Goals "to be my normal self " (feel like she can move around w/o the stiffness)    Currently in Pain? No/denies              [facility] PT Assessment - 01/15/21 0932      Assessment   Medical Diagnosis  Lumbar stiffness s/p lumbar microdiscectomy    Referring Provider (PT) Eustace Moore, MD    Onset Date/Surgical Date 11/07/20    Next MD Visit none scheduled      Observation/Other Assessments   Focus on Therapeutic Outcomes (FOTO)  Lumbar:  FS=94 (37 point improvement)      AROM   Lumbar Flexion hands to toes    Lumbar Extension WFL    Lumbar - Right Side Bend hand to fibular head - tight    Lumbar - Left Side Bend hand to 2" below fibular head    Lumbar - Right Rotation [facility]    Lumbar - Left Rotation [facility]      Strength   Right Hip Flexion 5/5    Right Hip Extension 4+/5    Right Hip External Rotation  4+/5    Right Hip Internal Rotation 4+/5    Right Hip ABduction 5/5    Right Hip ADduction 5/5    Left Hip Flexion 5/5    Left Hip Extension 4/5    Left Hip External Rotation 4/5    Left Hip Internal Rotation 4/5    Left Hip ABduction 4+/5    Left Hip ADduction 4+/5    Right Knee Flexion 5/5    Right Knee Extension 5/5    Left  Knee Flexion 5/5    Left Knee Extension 5/5    Right Ankle Dorsiflexion 5/5    Right Ankle Plantar Flexion 4/5   14 SLS heel raises   Left Ankle Dorsiflexion 5/5    Left Ankle Plantar Flexion 4/5   11 SLS heel raises                        [facility] Adult PT Treatment/Exercise - 01/15/21 0932      Exercises   Exercises Lumbar      Lumbar Exercises: Aerobic   Recumbent Bike L2 x 6 min      Neck Exercises: Stretches   Upper Trapezius Stretch Right;Left;1 rep;30 seconds    Upper Trapezius Stretch Limitations hand under hip to prevent shoulder hike                  PT Education - 01/15/21 1006    Education Details HEP update - [id number]    Person(s) Educated Patient    Methods Explanation;Demonstration;Handout    Comprehension Verbalized understanding;Returned demonstration            PT Short Term Goals - 01/11/21 1023      PT SHORT TERM GOAL #1   Title Patient will be independent with initial HEP    Status  Achieved   01/04/21     PT SHORT TERM GOAL #2   Title Patient will verbalize/demonstrate understanding of neutral spine posture and proper body mechanics to reduce strain on lumbar spine    Status Achieved   01/11/21            PT Long Term Goals - 01/15/21 0937      PT LONG TERM GOAL #1   Title Patient will be independent with ongoing/advanced HEP +/- gym program for self-management at home    Status Achieved   01/15/21     PT LONG TERM GOAL #2   Title Patient to demonstrate appropriate posture and body mechanics needed for daily activities    Status Achieved   01/15/21     PT LONG TERM GOAL #3   Title Patient to improve lumbar AROM to WFL/WNL without pain provocation    Status Achieved   01/15/21     PT LONG TERM GOAL #4   Title Patient will demonstrate improved B LE strength to >/= 4+/5 for improved stability and ease of mobility    Status Partially Met   01/15/21 - met except L hip extension, IR/ER 4/5     PT LONG TERM GOAL #5   Title Patient to report ability to perform ADLs, household, and work-related tasks without limitation due to LBP, LOM or LE weakness    Status Achieved   01/15/21                Plan - 01/15/21 0938    Clinical Impression Statement Vone reports no recent pain or limitation with typical daily tasks. She reports good understanding on posture and body mechanics training and denies need for review. Lumbar AROM WFL with only mild tightness noted. Overall LE strength improved and grossly 4+ to 5/5 with exception of L hip extension, IR & ER as well as L PF but pt confident with ongoing HEP to continue to address these areas of weakness. All goals met with exception of strength goal only partially met. Pt interested in transitioning to the HEP at this time but would like to remain on hold for 30 days in  the event that issues arise as she continues with the HEP.    Comorbidities L4-5 lumbar laminectomy & microdiscectomy 11/07/20, OA, DM, h/o C-section x 2     Rehab Potential Good    PT Frequency --    PT Duration --    PT Treatment/Interventions ADLs/Self Care Home Management;Cryotherapy;Electrical Stimulation;Iontophoresis 42m/ml Dexamethasone;Moist Heat;Ultrasound;Gait training;Stair training;Functional mobility training;Therapeutic activities;Therapeutic exercise;Balance training;Neuromuscular re-education;Patient/family education;Manual techniques;Scar mobilization;Passive range of motion;Dry needling;Taping    PT Next Visit Plan transition to HEP + 30-day hold    PT Home Exercise Plan Medbridge Access Code: [email or web address](2/17),  [email or web address] (3/11); [email or web address](3/15)    Consulted and Agree with Plan of Care Patient           Patient will benefit from skilled therapeutic intervention in order to improve the following deficits and impairments:  Abnormal gait,Decreased activity tolerance,Decreased knowledge of precautions,Decreased mobility,Decreased range of motion,Decreased strength,Difficulty walking,Hypomobility,Increased fascial restricitons,Increased muscle spasms,Impaired perceived functional ability,Impaired flexibility,Postural dysfunction,Improper body mechanics,Pain  Visit Diagnosis: Radiculopathy, lumbar region  Other symptoms and signs involving the musculoskeletal system  Muscle weakness (generalized)     Problem List Patient Active Problem List   Diagnosis Date Noted  . Left-sided weakness 11/07/2020  . S/P lumbar laminectomy 11/07/2020  . Hemorrhoids, internal, with bleeding & occ prolapse 03/20/2014  . Constipation, chronic 03/20/2014    JPercival Spanish PT, MPT 01/15/2021, 10:14 AM  [facility] [place] [address]Phone: [phone]  Fax:  [phone] Name: SAnnaliz AvenMRN: [id number] of Birth: 108-Jul-1987  PHYSICAL THERAPY DISCHARGE SUMMARY  Visits from Start of Care: 7  Current functional level related to goals /  functional outcomes:   Refer to above clinical impression for status as of last visit on 01/15/2021. Patient was placed on hold for 30 days and has not needed to return to PT, therefore will proceed with discharge from PT for this episode.   Remaining deficits:   As above.   Education / Equipment:   HEP, pTraining and development officereducation  Plan: Patient agrees to discharge.  Patient goals were mostly met. Patient is being discharged due to being pleased with the current functional level.  ?????     JPercival Spanish PT, MPT 03/11/21, 2:40 PM  [facility] [place] [address]Phone: [phone]  Fax:  [phone]

## 2021-01-25 ENCOUNTER — Encounter: Payer: 59 | Admitting: Physical Therapy

## 2021-07-18 ENCOUNTER — Encounter (HOSPITAL_BASED_OUTPATIENT_CLINIC_OR_DEPARTMENT_OTHER): Payer: Self-pay | Admitting: *Deleted

## 2021-07-18 ENCOUNTER — Observation Stay (HOSPITAL_BASED_OUTPATIENT_CLINIC_OR_DEPARTMENT_OTHER)
Admission: EM | Admit: 2021-07-18 | Discharge: 2021-07-20 | Disposition: A | Discharge status: Home / Self Care | Payer: 59 | Attending: Emergency Medicine | Admitting: Emergency Medicine

## 2021-07-18 ENCOUNTER — Other Ambulatory Visit: Payer: Self-pay

## 2021-07-18 DIAGNOSIS — E876 Hypokalemia: Secondary | ICD-10-CM | POA: Insufficient documentation

## 2021-07-18 DIAGNOSIS — K922 Gastrointestinal hemorrhage, unspecified: Secondary | ICD-10-CM | POA: Diagnosis not present

## 2021-07-18 DIAGNOSIS — Z91040 Latex allergy status: Secondary | ICD-10-CM | POA: Diagnosis not present

## 2021-07-18 DIAGNOSIS — K635 Polyp of colon: Principal | ICD-10-CM | POA: Insufficient documentation

## 2021-07-18 DIAGNOSIS — Z20822 Contact with and (suspected) exposure to covid-19: Secondary | ICD-10-CM | POA: Diagnosis not present

## 2021-07-18 DIAGNOSIS — K648 Other hemorrhoids: Secondary | ICD-10-CM | POA: Insufficient documentation

## 2021-07-18 DIAGNOSIS — E119 Type 2 diabetes mellitus without complications: Secondary | ICD-10-CM | POA: Insufficient documentation

## 2021-07-18 DIAGNOSIS — Z7984 Long term (current) use of oral hypoglycemic drugs: Secondary | ICD-10-CM | POA: Diagnosis not present

## 2021-07-18 DIAGNOSIS — R7989 Other specified abnormal findings of blood chemistry: Secondary | ICD-10-CM

## 2021-07-18 DIAGNOSIS — K644 Residual hemorrhoidal skin tags: Secondary | ICD-10-CM | POA: Diagnosis not present

## 2021-07-18 DIAGNOSIS — R945 Abnormal results of liver function studies: Secondary | ICD-10-CM

## 2021-07-18 DIAGNOSIS — D649 Anemia, unspecified: Secondary | ICD-10-CM

## 2021-07-18 LAB — COMPREHENSIVE METABOLIC PANEL
ALT: 81 U/L — ABNORMAL HIGH (ref 0–44)
AST: 78 U/L — ABNORMAL HIGH (ref 15–41)
Albumin: 4.2 g/dL (ref 3.5–5.0)
Alkaline Phosphatase: 58 U/L (ref 38–126)
Anion gap: 8 (ref 5–15)
BUN: 14 mg/dL (ref 6–20)
CO2: 22 mmol/L (ref 22–32)
Calcium: 8.8 mg/dL — ABNORMAL LOW (ref 8.9–10.3)
Chloride: 104 mmol/L (ref 98–111)
Creatinine, Ser: 0.62 mg/dL (ref 0.44–1.00)
GFR, Estimated: >60 mL/min (ref >60)
Glucose, Bld: 101 mg/dL — ABNORMAL HIGH (ref 70–99)
Potassium: 3.3 mmol/L — ABNORMAL LOW (ref 3.5–5.1)
Sodium: 134 mmol/L — ABNORMAL LOW (ref 135–145)
Total Bilirubin: 0.8 mg/dL (ref 0.3–1.2)
Total Protein: 8.7 g/dL — ABNORMAL HIGH (ref 6.5–8.1)

## 2021-07-18 LAB — URINALYSIS, ROUTINE W REFLEX MICROSCOPIC
Bilirubin Urine: NEGATIVE
Glucose, UA: NEGATIVE mg/dL
Hgb urine dipstick: NEGATIVE
Ketones, ur: 80 mg/dL — AB
Leukocytes,Ua: NEGATIVE
Nitrite: NEGATIVE
Protein, ur: NEGATIVE mg/dL
Specific Gravity, Urine: 1.01 (ref 1.005–1.030)
pH: 6 (ref 5.0–8.0)

## 2021-07-18 LAB — PREGNANCY, URINE: Preg Test, Ur: NEGATIVE

## 2021-07-18 LAB — PROTIME-INR
INR: 1.1 (ref 0.8–1.2)
Prothrombin Time: 13.8 seconds (ref 11.4–15.2)

## 2021-07-18 NOTE — ED Triage Notes (Signed)
Her HGB is 6.3 per her MD. She was called at home and told her to come to the hospital and have a blood transfusion. Bright red blood in her stools x 2 years without tx. Hx of diagnosis of anemia.

## 2021-07-18 NOTE — ED Provider Notes (Signed)
[facility] EMERGENCY DEPARTMENT Provider Note   CSN: [id number] Arrival date & time: 07/18/21  1817     History Chief Complaint  Patient presents with   HGB 6.3    Veronica Cannon is a 35 y.o. female.  The history is provided by the patient and medical records. No language interpreter was used.  Illness Location:  Anemia found by PCP Quality:  6.3 Severity:  Severe Onset quality:  Unable to specify Timing:  Constant Progression:  Unable to specify Chronicity:  Recurrent Context:  Chronic intermittnet rectal bleeding with internal hemorrhoids. No bleeding last few days reported Associated symptoms: fatigue and shortness of breath (with exertion)   Associated symptoms: no abdominal pain, no chest pain, no congestion, no cough, no diarrhea, no fever, no headaches, no loss of consciousness, no nausea, no rash, no vomiting and no wheezing   Shortness of breath:    Severity:  Moderate   Onset quality:  Gradual   Timing:  Intermittent   Progression:  Waxing and waning Risk factors:  Exertional SOB     Past Medical History:  Diagnosis Date   Gestational diabetes mellitus, antepartum    Pre-eclampsia, severe, delivered 01/30/2014    Patient Active Problem List   Diagnosis Date Noted   Left-sided weakness 11/07/2020   S/P lumbar laminectomy 11/07/2020   Hemorrhoids, internal, with bleeding & occ prolapse 03/20/2014   Constipation, chronic 03/20/2014    Past Surgical History:  Procedure Laterality Date   CESAREAN SECTION     CESAREAN SECTION N/A 01/29/2014   Procedure: Repeat Cesarean Section Delivery Baby Girl  @ 2112,  Apgars 8/9;  Surgeon: Essie Hart, MD;  Location: [facility];  Service: Obstetrics;  Laterality: N/A;   COLONOSCOPY N/A 10/13/2018   Procedure: COLONOSCOPY;  Surgeon: Romie Levee, MD;  Location: [facility];  Service: Endoscopy;  Laterality: N/A;     OB History     Gravida  2   Para  2   Term  1   Preterm  1   AB      Living  2       SAB      IAB      Ectopic      Multiple      Live Births  1           Family History  Problem Relation Age of Onset   Cancer Other    Diabetes Other    Hypertension Other    Heart disease Other     Social History   Tobacco Use   Smoking status: Never   Smokeless tobacco: Never  Substance Use Topics   Alcohol use: No   Drug use: No    Home Medications Prior to Admission medications   Medication Sig Start Date End Date Taking? Authorizing Provider  metFORMIN (GLUCOPHAGE-XR) 500 MG 24 hr tablet Take 500 mg by mouth daily. 09/04/20  Yes [provider]  methocarbamol (ROBAXIN) 500 MG tablet Take 500 mg by mouth every 6 (six) hours as needed for muscle spasms. 11/07/20   [provider]  methylPREDNISolone (MEDROL DOSEPAK) 4 MG TBPK tablet Take 4-24 mg by mouth See admin instructions. Day 1: 8mg  before breakfast, 4mg  after lunch, 4mg  after supper, 8mg  at bedtime. Day 2: 4mg  before breakfast, 4mg  after lunch, 4mg  after supper, 8mg  at bedtime. Day 3: 4mg  before breakfast, 4mg  after lunch, 4mg  after supper, 4mg  at bedtime. Day 4: 4mg  before breakfast, 4mg  after lunch, 4mg  at bedtime. Day 5:  4mg  before breakfast, 4mg  at bedtime. Day 6: 4mg  before breakfast. Patient not taking: No sig reported 11/07/20   [provider]  oxyCODONE-acetaminophen (PERCOCET/ROXICET) 5-325 MG tablet Take 1 tablet by mouth every 6 (six) hours as needed for severe pain. 11/07/20   [provider]    Allergies    Cefuroxime and Latex  Review of Systems   Review of Systems  Constitutional:  Positive for fatigue. Negative for chills, diaphoresis and fever.  HENT:  Negative for congestion.   Eyes:  Negative for visual disturbance.  Respiratory:  Positive for shortness of breath (with exertion). Negative for cough, chest tightness and wheezing.   Cardiovascular:  Negative for chest pain, palpitations and leg swelling.  Gastrointestinal:  Positive for blood in  stool. Negative for abdominal pain, constipation, diarrhea, nausea and vomiting.  Genitourinary:  Negative for dysuria and flank pain.  Musculoskeletal:  Negative for back pain, neck pain and neck stiffness.  Skin:  Negative for rash and wound.  Neurological:  Positive for light-headedness. Negative for dizziness, loss of consciousness, syncope, weakness and headaches.  Psychiatric/Behavioral:  Negative for agitation.    Physical Exam Updated Vital Signs BP 130/89 (BP Location: Right Arm)   Pulse 100   Temp 99.1 F (37.3 C) (Oral)   Resp 18   Ht 5\' 6"  (1.676 m)   Wt 78 kg   SpO2 100%   BMI 27.76 kg/m   Physical Exam Vitals and nursing note reviewed.  Constitutional:      General: She is not in acute distress.    Appearance: She is well-developed. She is not ill-appearing, toxic-appearing or diaphoretic.  HENT:     Head: Normocephalic and atraumatic.     Nose: No congestion or rhinorrhea.     Mouth/Throat:     Mouth: Mucous membranes are dry.     Pharynx: No oropharyngeal exudate or posterior oropharyngeal erythema.  Eyes:     Conjunctiva/sclera: Conjunctivae normal.     Pupils: Pupils are equal, round, and reactive to light.  Cardiovascular:     Rate and Rhythm: Normal rate and regular rhythm.     Heart sounds: No murmur heard. Pulmonary:     Effort: Pulmonary effort is normal. No respiratory distress.     Breath sounds: Normal breath sounds.  Abdominal:     General: Abdomen is flat. There is no distension.     Palpations: Abdomen is soft.     Tenderness: There is no abdominal tenderness. There is no guarding or rebound.  Genitourinary:    Comments: Deferred by patient initially Musculoskeletal:     Cervical back: Neck supple. No tenderness.  Skin:    General: Skin is warm and dry.     Capillary Refill: Capillary refill takes less than 2 seconds.     Coloration: Skin is pale.     Findings: No rash.  Neurological:     General: No focal deficit present.     Mental  Status: She is alert.  Psychiatric:        Mood and Affect: Mood normal.    ED Results / Procedures / Treatments   Labs (all labs ordered are listed, but only abnormal results are displayed) Labs Reviewed  CBC WITH DIFFERENTIAL/PLATELET - Abnormal; Notable for the following components:      Result Value   Hemoglobin 6.4 (*)    HCT 24.7 (*)    MCV 50.9 (*)    MCH 13.2 (*)    MCHC 25.9 (*)  RDW 24.1 (*)    Platelets 434 (*)    All other components within normal limits  COMPREHENSIVE METABOLIC PANEL - Abnormal; Notable for the following components:   Sodium 134 (*)    Potassium 3.3 (*)    Glucose, Bld 101 (*)    Calcium 8.8 (*)    Total Protein 8.7 (*)    AST 78 (*)    ALT 81 (*)    All other components within normal limits  URINALYSIS, ROUTINE W REFLEX MICROSCOPIC - Abnormal; Notable for the following components:   Ketones, ur 80 (*)    All other components within normal limits  SARS CORONAVIRUS 2 (TAT 6-24 HRS)  PROTIME-INR  PREGNANCY, URINE  VITAMIN B12  FOLATE  IRON AND TIBC  FERRITIN  RETICULOCYTES    EKG None  Radiology No results found.  Procedures Procedures     Medications Ordered in ED Medications - No data to display  ED Course  I have reviewed the triage vital signs and the nursing notes.  Pertinent labs & imaging results that were available during my care of the patient were reviewed by me and considered in my medical decision making (see chart for details).    MDM Rules/Calculators/A&P                           Dejon Milo is a 35 y.o. 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  According to patient he is chronically anemic due to internal hemorrhoids that we have seen in the past.  He states that she has had fatigue for several months but is  continuing.  He also reports exertional shortness of breath for months.  She says that she went to a follow-up visit with her PCP today and had some blood work drawn.  She was called 36.3 and she is going to the emergency department for blood transfusion evaluation.  She reports no tachycardia today or over the last few days.  She denies any abdominal pain or chest pain.  She reports exertional shortness of breath is similar to prior.  She does report feeling fatigued denies any syncope.  Denies any palpitations, nausea, vomiting, constipation or diarrhea.  She does report occasional rectal bleeding that is bright but no rectal pain.  She denies any vaginal symptoms.  She denies dysuria or urinary changes.  No other complaints reported she is pale.  On exam patient is pale.  Lungs are clear and chest is nontender.  No murmur.  Abdomen is nontender.  No focal neurologic deficits on exam.  A rectal exam was offered however patient has had a colonoscopy and work-up with no reported internal hemorrhoids and denies any rectal bleeding or pain today so she decided to pass on this exam  Clinically I do suspect that is patient is anemic however we will get some screening blood work confirm this.    Anticipate shared decision-making conversation about management with either ED transfer for blood transfusion versus admission for symptomatic anemia for blood transfusion and reassessment and monitoring.  9:21 PM Work-up has returned and hemoglobin is indeed low at 6.4.  On review of the chart, it does appear her last hemoglobin was 11.9 so this is more of a significant drop than anticipated.  Given her exertional shortness of breath and lightheadedness with  fatigue symptoms, I do feel she needs admission for symptomatic anemia and blood transfusion.  We do not have a really transfuse a hemodialysis table patient at this facility so she will be admitted for blood transfusion and monitoring.   Final Clinical  Impression(s) / ED Diagnoses Final diagnoses:  Symptomatic anemia      Clinical Impression: 1. Symptomatic anemia     Disposition: Admit  This note was prepared with assistance of Dragon voice recognition software. Occasional wrong-word or sound-a-like substitutions may have occurred due to the inherent limitations of voice recognition software.     Ondine Gemme, Canary Brim, MD 07/18/21 [phone]

## 2021-07-19 ENCOUNTER — Observation Stay (HOSPITAL_COMMUNITY): Payer: 59

## 2021-07-19 DIAGNOSIS — Z7984 Long term (current) use of oral hypoglycemic drugs: Secondary | ICD-10-CM | POA: Diagnosis not present

## 2021-07-19 DIAGNOSIS — K635 Polyp of colon: Secondary | ICD-10-CM | POA: Diagnosis not present

## 2021-07-19 DIAGNOSIS — E119 Type 2 diabetes mellitus without complications: Secondary | ICD-10-CM | POA: Diagnosis not present

## 2021-07-19 DIAGNOSIS — Z91040 Latex allergy status: Secondary | ICD-10-CM | POA: Diagnosis not present

## 2021-07-19 DIAGNOSIS — K648 Other hemorrhoids: Secondary | ICD-10-CM | POA: Diagnosis not present

## 2021-07-19 DIAGNOSIS — K922 Gastrointestinal hemorrhage, unspecified: Secondary | ICD-10-CM | POA: Diagnosis not present

## 2021-07-19 DIAGNOSIS — K644 Residual hemorrhoidal skin tags: Secondary | ICD-10-CM | POA: Diagnosis not present

## 2021-07-19 DIAGNOSIS — E876 Hypokalemia: Secondary | ICD-10-CM | POA: Diagnosis not present

## 2021-07-19 DIAGNOSIS — D649 Anemia, unspecified: Secondary | ICD-10-CM | POA: Diagnosis present

## 2021-07-19 DIAGNOSIS — Z20822 Contact with and (suspected) exposure to covid-19: Secondary | ICD-10-CM | POA: Diagnosis not present

## 2021-07-19 LAB — CBC WITH DIFFERENTIAL/PLATELET
Abs Immature Granulocytes: 0.03 10*3/uL (ref 0.00–0.07)
Abs Immature Granulocytes: 0.01 10*3/uL (ref 0.00–0.07)
Basophils Absolute: 0 10*3/uL (ref 0.0–0.1)
Basophils Absolute: 0 10*3/uL (ref 0.0–0.1)
Basophils Relative: 0 %
Basophils Relative: 1 %
Eosinophils Absolute: 0.1 10*3/uL (ref 0.0–0.5)
Eosinophils Absolute: 0.1 10*3/uL (ref 0.0–0.5)
Eosinophils Relative: 1 %
Eosinophils Relative: 1 %
HCT: 24.7 % — ABNORMAL LOW (ref 36.0–46.0)
HCT: 25.9 % — ABNORMAL LOW (ref 36.0–46.0)
Hemoglobin: 6.4 g/dL — CL (ref 12.0–15.0)
Hemoglobin: 6.6 g/dL — CL (ref 12.0–15.0)
Immature Granulocytes: 0 %
Immature Granulocytes: 0 %
Lymphocytes Relative: 31 %
Lymphocytes Relative: 36 %
Lymphs Abs: 2.8 10*3/uL (ref 0.7–4.0)
Lymphs Abs: 2.3 10*3/uL (ref 0.7–4.0)
MCH: 13.2 pg — ABNORMAL LOW (ref 26.0–34.0)
MCH: 13 pg — ABNORMAL LOW (ref 26.0–34.0)
MCHC: 25.9 g/dL — ABNORMAL LOW (ref 30.0–36.0)
MCHC: 25.5 g/dL — ABNORMAL LOW (ref 30.0–36.0)
MCV: 50.9 fL — ABNORMAL LOW (ref 80.0–100.0)
MCV: 51.2 fL — ABNORMAL LOW (ref 80.0–100.0)
Monocytes Absolute: 0.7 10*3/uL (ref 0.1–1.0)
Monocytes Absolute: 0.5 10*3/uL (ref 0.1–1.0)
Monocytes Relative: 7 %
Monocytes Relative: 8 %
Neutro Abs: 5.6 10*3/uL (ref 1.7–7.7)
Neutro Abs: 3.5 10*3/uL (ref 1.7–7.7)
Neutrophils Relative %: 61 %
Neutrophils Relative %: 54 %
Platelets: 434 10*3/uL — ABNORMAL HIGH (ref 150–400)
Platelets: 419 10*3/uL — ABNORMAL HIGH (ref 150–400)
RBC: 4.85 MIL/uL (ref 3.87–5.11)
RBC: 5.06 MIL/uL (ref 3.87–5.11)
RDW: 24.1 % — ABNORMAL HIGH (ref 11.5–15.5)
RDW: 24 % — ABNORMAL HIGH (ref 11.5–15.5)
Smear Review: ADEQUATE
WBC: 9.3 10*3/uL (ref 4.0–10.5)
WBC: 6.5 10*3/uL (ref 4.0–10.5)
nRBC: 0 % (ref 0.0–0.2)
nRBC: 0 % (ref 0.0–0.2)

## 2021-07-19 LAB — HEMOGLOBIN AND HEMATOCRIT, BLOOD
HCT: 24.4 % — ABNORMAL LOW (ref 36.0–46.0)
HCT: 27.9 % — ABNORMAL LOW (ref 36.0–46.0)
Hemoglobin: 6.2 g/dL — CL (ref 12.0–15.0)
Hemoglobin: 7.4 g/dL — ABNORMAL LOW (ref 12.0–15.0)

## 2021-07-19 LAB — COMPREHENSIVE METABOLIC PANEL
ALT: 76 U/L — ABNORMAL HIGH (ref 0–44)
AST: 76 U/L — ABNORMAL HIGH (ref 15–41)
Albumin: 4 g/dL (ref 3.5–5.0)
Alkaline Phosphatase: 50 U/L (ref 38–126)
Anion gap: 7 (ref 5–15)
BUN: 9 mg/dL (ref 6–20)
CO2: 21 mmol/L — ABNORMAL LOW (ref 22–32)
Calcium: 9.1 mg/dL (ref 8.9–10.3)
Chloride: 108 mmol/L (ref 98–111)
Creatinine, Ser: 0.46 mg/dL (ref 0.44–1.00)
GFR, Estimated: >60 mL/min (ref >60)
Glucose, Bld: 99 mg/dL (ref 70–99)
Potassium: 3.7 mmol/L (ref 3.5–5.1)
Sodium: 136 mmol/L (ref 135–145)
Total Bilirubin: 1.2 mg/dL (ref 0.3–1.2)
Total Protein: 8.2 g/dL — ABNORMAL HIGH (ref 6.5–8.1)

## 2021-07-19 LAB — RETICULOCYTES
Immature Retic Fract: 17.5 % — ABNORMAL HIGH (ref 2.3–15.9)
RBC.: 4.28 MIL/uL (ref 3.87–5.11)
Retic Count, Absolute: 74.5 10*3/uL (ref 19.0–186.0)
Retic Ct Pct: 1.7 % (ref 0.4–3.1)

## 2021-07-19 LAB — IRON AND TIBC
Iron: 15 ug/dL — ABNORMAL LOW (ref 28–170)
Saturation Ratios: 2 % — ABNORMAL LOW (ref 10.4–31.8)
TIBC: 679 ug/dL — ABNORMAL HIGH (ref 250–450)
UIBC: 664 ug/dL

## 2021-07-19 LAB — CBG MONITORING, ED: Glucose-Capillary: 110 mg/dL — ABNORMAL HIGH (ref 70–99)

## 2021-07-19 LAB — HIV ANTIBODY (ROUTINE TESTING W REFLEX): HIV Screen 4th Generation wRfx: NONREACTIVE

## 2021-07-19 LAB — MAGNESIUM: Magnesium: 2 mg/dL (ref 1.7–2.4)

## 2021-07-19 LAB — VITAMIN B12: Vitamin B-12: 361 pg/mL (ref 180–914)

## 2021-07-19 LAB — FERRITIN: Ferritin: 6 ng/mL — ABNORMAL LOW (ref 11–307)

## 2021-07-19 LAB — FOLATE: Folate: 60.9 ng/mL (ref >5.9)

## 2021-07-19 LAB — SARS CORONAVIRUS 2 (TAT 6-24 HRS): SARS Coronavirus 2: NEGATIVE

## 2021-07-19 LAB — HEPATITIS B SURFACE ANTIGEN: Hepatitis B Surface Ag: NONREACTIVE

## 2021-07-19 LAB — PREPARE RBC (CROSSMATCH)

## 2021-07-19 MED ORDER — SODIUM CHLORIDE 0.9% IV SOLUTION: Freq: Once | INTRAVENOUS | Status: AC

## 2021-07-19 MED ORDER — SODIUM CHLORIDE 0.9 % IV SOLN: INTRAVENOUS | Status: DC

## 2021-07-19 MED ORDER — PEG 3350-KCL-NA BICARB-NACL 420 G PO SOLR
4000.0000 mL | Freq: Once | ORAL | Status: AC
  Administered 2021-07-19: 4000 mL via ORAL

## 2021-07-19 MED ORDER — POTASSIUM CHLORIDE CRYS ER 20 MEQ PO TBCR
40.0000 meq | EXTENDED_RELEASE_TABLET | Freq: Every day | ORAL | Status: DC
  Administered 2021-07-19: 40 meq via ORAL
  Filled 2021-07-19: qty 2

## 2021-07-19 MED ORDER — ONDANSETRON HCL 4 MG PO TABS: 4.0000 mg | ORAL_TABLET | Freq: Four times a day (QID) | ORAL | Status: DC | PRN

## 2021-07-19 MED ORDER — SODIUM CHLORIDE 0.9 % IV SOLN
250.0000 mg | Freq: Once | INTRAVENOUS | Status: AC
  Administered 2021-07-19: 250 mg via INTRAVENOUS
  Filled 2021-07-19: qty 20

## 2021-07-19 MED ORDER — ONDANSETRON HCL 4 MG/2ML IJ SOLN: 4.0000 mg | Freq: Four times a day (QID) | INTRAMUSCULAR | Status: DC | PRN

## 2021-07-19 MED ORDER — HYDROCODONE-ACETAMINOPHEN 5-325 MG PO TABS: 1.0000 | ORAL_TABLET | ORAL | Status: DC | PRN

## 2021-07-19 MED ORDER — ACETAMINOPHEN 650 MG RE SUPP: 650.0000 mg | Freq: Four times a day (QID) | RECTAL | Status: DC | PRN

## 2021-07-19 MED ORDER — ACETAMINOPHEN 325 MG PO TABS: 650.0000 mg | ORAL_TABLET | Freq: Four times a day (QID) | ORAL | Status: DC | PRN

## 2021-07-19 NOTE — ED Notes (Signed)
Report to Dahlia Client, Charity fundraiser at [employer].

## 2021-07-19 NOTE — H&P (View-Only) (Signed)
[facility]astroenterology Consult  Referring Provider: [facility]/Dr.Kyle Primary Care Physician:  Kerin Salen, PA-C Primary Gastroenterologist: Gentry Fitz  Reason for Consultation: Rectal bleeding, symptomatic anemia  HPI: Veronica Cannon is a 35 y.o. female was advised by her primary care physician to proceed to the ER when she was found to have hemoglobin of around 6 on outpatient labs. Patient states that 3 months ago her hemoglobin was around 11, in the ER she was found to have a hemoglobin of 6.4.  Patient states that 2 years ago she started with rectal bleeding described as bright red blood and small in amount for which she underwent a diagnostic colonoscopy with Dr. Lendon Ka which was described as normal colon with perianal skin tags. However, she states that in the last 1 year rectal bleeding has progressively worsened and become more persistent, occurs on a daily basis, described as bright red blood about a tablespoon to a cupful with each bowel movement, usually once a day. This is associated with cramping in the lower abdomen. Patient denies diarrhea or mucus in stools. She denies nocturnal symptoms. She denies unintentional weight loss, loss of appetite. She denies nausea, vomiting, acid reflux, heartburn, difficulty swallowing or pain on swallowing. She denies regular use of NSAIDs, aspirin or any blood thinners.  Patient denies family history of colon cancer or inflammatory bowel disease. Patient complains of progressively worsening fatigue, exertional shortness of breath, some dizziness on standing and walking and intermittent mild chest pain.  Past Medical History:  Diagnosis Date   Gestational diabetes mellitus, antepartum    Pre-eclampsia, severe, delivered 01/30/2014    Past Surgical History:  Procedure Laterality Date   CESAREAN SECTION     CESAREAN SECTION N/A 01/29/2014   Procedure: Repeat Cesarean Section Delivery Baby Girl  @ 2112,   Apgars 8/9;  Surgeon: Essie Hart, MD;  Location: [facility];  Service: Obstetrics;  Laterality: N/A;   COLONOSCOPY N/A 10/13/2018   Procedure: COLONOSCOPY;  Surgeon: Romie Levee, MD;  Location: [facility];  Service: Endoscopy;  Laterality: N/A;    Prior to Admission medications   Medication Sig Start Date End Date Taking? Authorizing Provider  metFORMIN (GLUCOPHAGE-XR) 500 MG 24 hr tablet Take 500 mg by mouth daily. 09/04/20  Yes [provider]    Current Facility-Administered Medications  Medication Dose Route Frequency Provider Last Rate Last Admin   0.9 %  sodium chloride infusion (Manually program via Guardrails IV Fluids)   Intravenous Once Ronaldo Miyamoto, Tyrone A, DO       acetaminophen (TYLENOL) tablet 650 mg  650 mg Oral Q6H PRN Ronaldo Miyamoto, Tyrone A, DO       Or   acetaminophen (TYLENOL) suppository 650 mg  650 mg Rectal Q6H PRN Ronaldo Miyamoto, Tyrone A, DO       ferric gluconate (FERRLECIT) 250 mg in sodium chloride 0.9 % 250 mL IVPB  250 mg Intravenous Once Kyle, Tyrone A, DO       HYDROcodone-acetaminophen (NORCO/VICODIN) 5-325 MG per tablet 1-2 tablet  1-2 tablet Oral Q4H PRN Ronaldo Miyamoto, Tyrone A, DO       ondansetron (ZOFRAN) tablet 4 mg  4 mg Oral Q6H PRN Ronaldo Miyamoto, Tyrone A, DO       Or   ondansetron (ZOFRAN) injection 4 mg  4 mg Intravenous Q6H PRN Ronaldo Miyamoto, Tyrone A, DO       potassium chloride SA (KLOR-CON) CR tablet 40 mEq  40 mEq Oral Daily Kyle, Tyrone A, DO        Allergies as  of 07/18/2021 - Review Complete 07/18/2021  Allergen Reaction Noted   Cefuroxime Hives 06/18/2011   Latex Rash 10/11/2018    Family History  Problem Relation Age of Onset   Cancer Other    Diabetes Other    Hypertension Other    Heart disease Other     Social History   Socioeconomic History   Marital status: Married    Spouse name: Not on file   Number of children: Not on file   Years of education: Not on file   Highest education level: Not on file  Occupational History   Not on file  Tobacco Use    Smoking status: Never   Smokeless tobacco: Never  Substance and Sexual Activity   Alcohol use: No   Drug use: No   Sexual activity: Yes  Other Topics Concern   Not on file  Social History Narrative   Not on file   Social Determinants of Health   Financial Resource Strain: Not on file  Food Insecurity: Not on file  Transportation Needs: Not on file  Physical Activity: Not on file  Stress: Not on file  Social Connections: Not on file  Intimate Partner Violence: Not on file    Review of Systems: Positive for: GI: Described in detail in HPI.    Gen: fatigue, weakness, malaise,  denies any fever, chills, rigors, night sweats, anorexia, involuntary weight loss, and sleep disorder CV: chest pain,  denies angina, palpitations, syncope, orthopnea, PND, peripheral edema, and claudication. Resp: Denies dyspnea, cough, sputum, wheezing, coughing up blood. GU : Denies urinary burning, blood in urine, urinary frequency, urinary hesitancy, nocturnal urination, and urinary incontinence. MS: Denies joint pain or swelling.  Denies muscle weakness, cramps, atrophy.  Derm: Denies rash, itching, oral ulcerations, hives, unhealing ulcers.  Psych: Denies depression, anxiety, memory loss, suicidal ideation, hallucinations,  and confusion. Heme: Denies bruising, bleeding, and enlarged lymph nodes. Neuro:  dizziness, denies any headaches, paresthesias. Endo:  DM, denies any problems with thyroid, adrenal function.  Physical Exam: Vital signs in last 24 hours: Temp:  [98.7 F (37.1 C)-99.1 F (37.3 C)] 98.7 F (37.1 C) (09/16 0922) Pulse Rate:  [78-100] 78 (09/16 0922) Resp:  [16-27] 16 (09/16 0922) BP: (109-140)/(59-89) 129/76 (09/16 0922) SpO2:  [99 %-100 %] 100 % (09/16 0922) Weight:  [78 kg] 78 kg (09/15 1827)    General:   Alert,  Well-developed, well-nourished, pleasant and cooperative in NAD Head:  Normocephalic and atraumatic. Eyes:  Sclera clear, no icterus.   Prominent pallor Ears:   Normal auditory acuity. Nose:  No deformity, discharge,  or lesions. Mouth:  No deformity or lesions.  Oropharynx pink & moist. Neck:  Supple; no masses or thyromegaly. Lungs:  Clear throughout to auscultation.   No wheezes, crackles, or rhonchi. No acute distress. Heart:  Regular rate and rhythm; no murmurs, clicks, rubs,  or gallops. Extremities:  Without clubbing or edema. Neurologic:  Alert and  oriented x4;  grossly normal neurologically. Skin:  Intact without significant lesions or rashes. Psych:  Alert and cooperative. Normal mood and affect. Abdomen:  Soft, nontender and nondistended. No masses, hepatosplenomegaly or hernias noted. Normal bowel sounds, without guarding, and without rebound.         Lab Results: Recent Labs    07/18/21 1903 07/19/21 0640  WBC 9.3 6.5  HGB 6.4* 6.6*  HCT 24.7* 25.9*  PLT 434* 419*   BMET Recent Labs    07/18/21 1903  NA 134*  K 3.3*  CL 104  CO2 22  GLUCOSE 101*  BUN 14  CREATININE 0.62  CALCIUM 8.8*   LFT Recent Labs    07/18/21 1903  PROT 8.7*  ALBUMIN 4.2  AST 78*  ALT 81*  ALKPHOS 58  BILITOT 0.8   PT/INR Recent Labs    07/18/21 1903  LABPROT 13.8  INR 1.1    Studies/Results: No results found.  Impression: Rectal bleeding ongoing for 1 year Severe symptomatic anemia Hemoglobin 6.4, MCV 50.9 Thrombocytosis Severe iron deficiency, ferritin 6, iron saturation 2%, TIBC elevated at 679  Mild hypokalemia, mild hyponatremia Normal renal function, BUN 14 Slightly elevated AST of 78, ALT of 81 with normal total bilirubin and alkaline phosphatase, possible underlying fatty liver  Plan: Diagnostic colonoscopy with Dr. Levora Angel in a.m.Marland Kitchen Clear liquid diet today, colonic prep to be started at noon. IV Ferrlecit 250 mg x 1 dose has been ordered. Potassium supplementation ordered 1 unit PRBC transfusion has been ordered.  Will get hep B surface antigen, hep C antibody and ultrasound of the abdomen for further  evaluation of abnormal LFTs.     LOS: 0 days   Kerin Salen, MD  07/19/2021, 10:53 AM

## 2021-07-19 NOTE — ED Notes (Signed)
Report to [facility]  

## 2021-07-19 NOTE — Consult Note (Signed)
[facility] Consult  Referring Provider: [facility]/Dr.Kyle Primary Care Physician:  O'Connor, Lauren Elizabeth, PA-C Primary Gastroenterologist: Unassigned  Reason for Consultation: Rectal bleeding, symptomatic anemia  HPI: Veronica Cannon is a 35 y.o. female was advised by her primary care physician to proceed to the ER when she was found to have hemoglobin of around 6 on outpatient labs. Patient states that 3 months ago her hemoglobin was around 11, in the ER she was found to have a hemoglobin of 6.4.  Patient states that 2 years ago she started with rectal bleeding described as bright red blood and small in amount for which she underwent a diagnostic colonoscopy with Dr. Alecia Thomas which was described as normal colon with perianal skin tags. However, she states that in the last 1 year rectal bleeding has progressively worsened and become more persistent, occurs on a daily basis, described as bright red blood about a tablespoon to a cupful with each bowel movement, usually once a day. This is associated with cramping in the lower abdomen. Patient denies diarrhea or mucus in stools. She denies nocturnal symptoms. She denies unintentional weight loss, loss of appetite. She denies nausea, vomiting, acid reflux, heartburn, difficulty swallowing or pain on swallowing. She denies regular use of NSAIDs, aspirin or any blood thinners.  Patient denies family history of colon cancer or inflammatory bowel disease. Patient complains of progressively worsening fatigue, exertional shortness of breath, some dizziness on standing and walking and intermittent mild chest pain.  Past Medical History:  Diagnosis Date   Gestational diabetes mellitus, antepartum    Pre-eclampsia, severe, delivered 01/30/2014    Past Surgical History:  Procedure Laterality Date   CESAREAN SECTION     CESAREAN SECTION N/A 01/29/2014   Procedure: Repeat Cesarean Section Delivery Baby Girl  @ 2112,   Apgars 8/9;  Surgeon: Walda Pinn, MD;  Location: [facility];  Service: Obstetrics;  Laterality: N/A;   COLONOSCOPY N/A 10/13/2018   Procedure: COLONOSCOPY;  Surgeon: Thomas, Alicia, MD;  Location: [facility];  Service: Endoscopy;  Laterality: N/A;    Prior to Admission medications   Medication Sig Start Date End Date Taking? Authorizing Provider  metFORMIN (GLUCOPHAGE-XR) 500 MG 24 hr tablet Take 500 mg by mouth daily. 09/04/20  Yes [provider]    Current Facility-Administered Medications  Medication Dose Route Frequency Provider Last Rate Last Admin   0.9 %  sodium chloride infusion (Manually program via Guardrails IV Fluids)   Intravenous Once Kyle, Tyrone A, DO       acetaminophen (TYLENOL) tablet 650 mg  650 mg Oral Q6H PRN Kyle, Tyrone A, DO       Or   acetaminophen (TYLENOL) suppository 650 mg  650 mg Rectal Q6H PRN Kyle, Tyrone A, DO       ferric gluconate (FERRLECIT) 250 mg in sodium chloride 0.9 % 250 mL IVPB  250 mg Intravenous Once Kyle, Tyrone A, DO       HYDROcodone-acetaminophen (NORCO/VICODIN) 5-325 MG per tablet 1-2 tablet  1-2 tablet Oral Q4H PRN Kyle, Tyrone A, DO       ondansetron (ZOFRAN) tablet 4 mg  4 mg Oral Q6H PRN Kyle, Tyrone A, DO       Or   ondansetron (ZOFRAN) injection 4 mg  4 mg Intravenous Q6H PRN Kyle, Tyrone A, DO       potassium chloride SA (KLOR-CON) CR tablet 40 mEq  40 mEq Oral Daily Kyle, Tyrone A, DO        Allergies as   of 07/18/2021 - Review Complete 07/18/2021  Allergen Reaction Noted   Cefuroxime Hives 06/18/2011   Latex Rash 10/11/2018    Family History  Problem Relation Age of Onset   Cancer Other    Diabetes Other    Hypertension Other    Heart disease Other     Social History   Socioeconomic History   Marital status: Married    Spouse name: Not on file   Number of children: Not on file   Years of education: Not on file   Highest education level: Not on file  Occupational History   Not on file  Tobacco Use    Smoking status: Never   Smokeless tobacco: Never  Substance and Sexual Activity   Alcohol use: No   Drug use: No   Sexual activity: Yes  Other Topics Concern   Not on file  Social History Narrative   Not on file   Social Determinants of Health   Financial Resource Strain: Not on file  Food Insecurity: Not on file  Transportation Needs: Not on file  Physical Activity: Not on file  Stress: Not on file  Social Connections: Not on file  Intimate Partner Violence: Not on file    Review of Systems: Positive for: GI: Described in detail in HPI.    Gen: fatigue, weakness, malaise,  denies any fever, chills, rigors, night sweats, anorexia, involuntary weight loss, and sleep disorder CV: chest pain,  denies angina, palpitations, syncope, orthopnea, PND, peripheral edema, and claudication. Resp: Denies dyspnea, cough, sputum, wheezing, coughing up blood. GU : Denies urinary burning, blood in urine, urinary frequency, urinary hesitancy, nocturnal urination, and urinary incontinence. MS: Denies joint pain or swelling.  Denies muscle weakness, cramps, atrophy.  Derm: Denies rash, itching, oral ulcerations, hives, unhealing ulcers.  Psych: Denies depression, anxiety, memory loss, suicidal ideation, hallucinations,  and confusion. Heme: Denies bruising, bleeding, and enlarged lymph nodes. Neuro:  dizziness, denies any headaches, paresthesias. Endo:  DM, denies any problems with thyroid, adrenal function.  Physical Exam: Vital signs in last 24 hours: Temp:  [98.7 F (37.1 C)-99.1 F (37.3 C)] 98.7 F (37.1 C) (09/16 0922) Pulse Rate:  [78-100] 78 (09/16 0922) Resp:  [16-27] 16 (09/16 0922) BP: (109-140)/(59-89) 129/76 (09/16 0922) SpO2:  [99 %-100 %] 100 % (09/16 0922) Weight:  [78 kg] 78 kg (09/15 1827)    General:   Alert,  Well-developed, well-nourished, pleasant and cooperative in NAD Head:  Normocephalic and atraumatic. Eyes:  Sclera clear, no icterus.   Prominent pallor Ears:   Normal auditory acuity. Nose:  No deformity, discharge,  or lesions. Mouth:  No deformity or lesions.  Oropharynx pink & moist. Neck:  Supple; no masses or thyromegaly. Lungs:  Clear throughout to auscultation.   No wheezes, crackles, or rhonchi. No acute distress. Heart:  Regular rate and rhythm; no murmurs, clicks, rubs,  or gallops. Extremities:  Without clubbing or edema. Neurologic:  Alert and  oriented x4;  grossly normal neurologically. Skin:  Intact without significant lesions or rashes. Psych:  Alert and cooperative. Normal mood and affect. Abdomen:  Soft, nontender and nondistended. No masses, hepatosplenomegaly or hernias noted. Normal bowel sounds, without guarding, and without rebound.         Lab Results: Recent Labs    07/18/21 1903 07/19/21 0640  WBC 9.3 6.5  HGB 6.4* 6.6*  HCT 24.7* 25.9*  PLT 434* 419*   BMET Recent Labs    07/18/21 1903  NA 134*  K 3.3*  CL 104  CO2 22  GLUCOSE 101*  BUN 14  CREATININE 0.62  CALCIUM 8.8*   LFT Recent Labs    07/18/21 1903  PROT 8.7*  ALBUMIN 4.2  AST 78*  ALT 81*  ALKPHOS 58  BILITOT 0.8   PT/INR Recent Labs    07/18/21 1903  LABPROT 13.8  INR 1.1    Studies/Results: No results found.  Impression: Rectal bleeding ongoing for 1 year Severe symptomatic anemia Hemoglobin 6.4, MCV 50.9 Thrombocytosis Severe iron deficiency, ferritin 6, iron saturation 2%, TIBC elevated at 679  Mild hypokalemia, mild hyponatremia Normal renal function, BUN 14 Slightly elevated AST of 78, ALT of 81 with normal total bilirubin and alkaline phosphatase, possible underlying fatty liver  Plan: Diagnostic colonoscopy with Dr. Brahmbhatt in a.m.. Clear liquid diet today, colonic prep to be started at noon. IV Ferrlecit 250 mg x 1 dose has been ordered. Potassium supplementation ordered 1 unit PRBC transfusion has been ordered.  Will get hep B surface antigen, hep C antibody and ultrasound of the abdomen for further  evaluation of abnormal LFTs.     LOS: 0 days   Aubrynn Katona, MD  07/19/2021, 10:53 AM   

## 2021-07-19 NOTE — H&P (Signed)
History and Physical    Veronica Cannon GGY:[id number] DOB: 03/13/1986 DOA: 07/18/2021  PCP: Kerin Salen, PA-C  Patient coming from: Home  Chief Complaint: fatigue  HPI: Veronica Cannon is a 35 y.o. female with medical history significant of DM2. Presenting with fatigue, DOE. She reports that she's had daily bleeding with BM for over 2 years. She has seen a GI team years ago, but per her account, they were unable to find a cause. She continued as usual because she thought that was normal. She has noticed over the last few months she had become more fatigued. She continues to have bright red and dark blood w/ BMs. She has had increased dyspnea on exertion and intermittent lightheadedness w/ standing. She saw her PCP for regular follow up and lab work yesterday. It was noted that her Hgb was low. It was recommended that she come to the ED for assistance. She denies any other aggravating or alleviating factors.  ED Course: Hgb was found to be 6.6. TRH was called for admission.   Review of Systems:  Denies CP, palpitations, N/V/D, hematemesis, hematuria, irregular/heavy menstrual cycles. Review of systems is otherwise negative for all not mentioned in HPI.   PMHx Past Medical History:  Diagnosis Date   Gestational diabetes mellitus, antepartum    Pre-eclampsia, severe, delivered 01/30/2014    PSHx Past Surgical History:  Procedure Laterality Date   CESAREAN SECTION     CESAREAN SECTION N/A 01/29/2014   Procedure: Repeat Cesarean Section Delivery Baby Girl  @ 2112,  Apgars 8/9;  Surgeon: Essie Hart, MD;  Location: [facility];  Service: Obstetrics;  Laterality: N/A;   COLONOSCOPY N/A 10/13/2018   Procedure: COLONOSCOPY;  Surgeon: Romie Levee, MD;  Location: [facility];  Service: Endoscopy;  Laterality: N/A;    SocHx  reports that she has never smoked. She has never used smokeless tobacco. She reports that she does not drink alcohol and does not use drugs.  Allergies   Allergen Reactions   Cefuroxime Hives   Latex Rash    FamHx Family History  Problem Relation Age of Onset   Cancer Other    Diabetes Other    Hypertension Other    Heart disease Other     Prior to Admission medications   Medication Sig Start Date End Date Taking? Authorizing Provider  metFORMIN (GLUCOPHAGE-XR) 500 MG 24 hr tablet Take 500 mg by mouth daily. 09/04/20  Yes [provider]    Physical Exam: Vitals:   07/19/21 0600 07/19/21 0700 07/19/21 0800 07/19/21 0922  BP: 116/76 109/82 125/89 129/76  Pulse: 83 86 88 78  Resp: 20 (!) 27 (!) 26 16  Temp:    98.7 F (37.1 C)  TempSrc:    Oral  SpO2: 100% 100% 100% 100%  Weight:      Height:        General: 35 y.o. female resting in bed in NAD Eyes: PERRL, normal sclera ENMT: Nares patent w/o discharge, orophaynx clear, dentition normal, ears w/o discharge/lesions/ulcers Neck: Supple, trachea midline Cardiovascular: RRR, +S1, S2, no m/g/r, equal pulses throughout Respiratory: CTABL, no w/r/r, normal WOB GI: BS+, ND, periumbilical TTP, no masses noted, no organomegaly noted MSK: No e/c/c Skin: No rashes, bruises, ulcerations noted Neuro: A&O x 3, no focal deficits Psyc: Appropriate interaction but flat affect, calm/cooperative  Labs on Admission: I have personally reviewed following labs and imaging studies  CBC: Recent Labs  Lab 07/18/21 1903 07/19/21 0640  WBC 9.3 6.5  NEUTROABS 5.6 3.5  HGB 6.4* 6.6*  HCT 24.7* 25.9*  MCV 50.9* 51.2*  PLT 434* 419*   Basic Metabolic Panel: Recent Labs  Lab 07/18/21 1903  NA 134*  K 3.3*  CL 104  CO2 22  GLUCOSE 101*  BUN 14  CREATININE 0.62  CALCIUM 8.8*   GFR: Estimated Creatinine Clearance: 102.5 mL/min (by C-G formula based on SCr of 0.62 mg/dL). Liver Function Tests: Recent Labs  Lab 07/18/21 1903  AST 78*  ALT 81*  ALKPHOS 58  BILITOT 0.8  PROT 8.7*  ALBUMIN 4.2   No results for input(s): LIPASE, AMYLASE in the last 168 hours. No  results for input(s): AMMONIA in the last 168 hours. Coagulation Profile: Recent Labs  Lab 07/18/21 1903  INR 1.1   Cardiac Enzymes: No results for input(s): CKTOTAL, CKMB, CKMBINDEX, TROPONINI in the last 168 hours. BNP (last 3 results) No results for input(s): PROBNP in the last 8760 hours. HbA1C: No results for input(s): HGBA1C in the last 72 hours. CBG: Recent Labs  Lab 07/19/21 0838  GLUCAP 110*   Lipid Profile: No results for input(s): CHOL, HDL, LDLCALC, TRIG, CHOLHDL, LDLDIRECT in the last 72 hours. Thyroid Function Tests: No results for input(s): TSH, T4TOTAL, FREET4, T3FREE, THYROIDAB in the last 72 hours. Anemia Panel: Recent Labs    07/18/21 2251  VITAMINB12 361  FOLATE 60.9  FERRITIN 6*  TIBC 679*  IRON 15*  RETICCTPCT 1.7   Urine analysis:    Component Value Date/Time   COLORURINE YELLOW 07/18/2021 1923   APPEARANCEUR CLEAR 07/18/2021 1923   LABSPEC 1.010 07/18/2021 1923   PHURINE 6.0 07/18/2021 1923   GLUCOSEU NEGATIVE 07/18/2021 1923   HGBUR NEGATIVE 07/18/2021 1923   BILIRUBINUR NEGATIVE 07/18/2021 1923   KETONESUR 80 (A) 07/18/2021 1923   PROTEINUR NEGATIVE 07/18/2021 1923   UROBILINOGEN 0.2 01/29/2014 1655   NITRITE NEGATIVE 07/18/2021 1923   LEUKOCYTESUR NEGATIVE 07/18/2021 1923    Radiological Exams on Admission: No results found.  EKG: None obtained in ED  Assessment/Plan GIB Symptomatic microcytic anemia     - placed on obs, med-surg     - follow q6h H&H     - transfuse one unit pRBCs and give 250mg  of ferrlecit     - iron studies noted     - consulted [facility]; appreciate assistance; c-scope in AM     - CLD today  Abdominal pain     - ab ordered  Elevated LFTs     - ab [place] ordered     - hepatitis panel ordered  Hypokalemia     - replace K+, check Mg2+  DVT prophylaxis: SCDs  Code Status: FULL  Family Communication: None at bedside  Consults called: GI   Status is: Observation  The patient remains OBS  appropriate and will d/c before 2 midnights.  Dispo: The patient is from: Home              Anticipated d/c is to: Home              Patient currently is not medically stable to d/c.   Difficult to place patient No  Time spent coordinating admission: 45 minutes   Veronica Cannon A Aubrianna Orchard DO [facility]  If 7PM-7AM, please contact night-coverage [email or web address]  07/19/2021, 10:09 AM

## 2021-07-19 NOTE — ED Notes (Signed)
Pt. has no complaints at this time. Resting quietly.

## 2021-07-20 ENCOUNTER — Encounter (HOSPITAL_COMMUNITY): Payer: Self-pay | Admitting: Internal Medicine

## 2021-07-20 ENCOUNTER — Inpatient Hospital Stay (HOSPITAL_COMMUNITY): Payer: 59 | Admitting: Certified Registered Nurse Anesthetist

## 2021-07-20 ENCOUNTER — Encounter (HOSPITAL_COMMUNITY)
Admission: EM | Disposition: A | Discharge status: Home / Self Care | Payer: Self-pay | Source: Home / Self Care | Attending: Emergency Medicine

## 2021-07-20 DIAGNOSIS — D649 Anemia, unspecified: Secondary | ICD-10-CM

## 2021-07-20 HISTORY — PX: COLONOSCOPY WITH PROPOFOL: SHX5780

## 2021-07-20 HISTORY — PX: BIOPSY: SHX5522

## 2021-07-20 LAB — CBC
HCT: 31.3 % — ABNORMAL LOW (ref 36.0–46.0)
Hemoglobin: 8.1 g/dL — ABNORMAL LOW (ref 12.0–15.0)
MCH: 14.4 pg — ABNORMAL LOW (ref 26.0–34.0)
MCHC: 25.9 g/dL — ABNORMAL LOW (ref 30.0–36.0)
MCV: 55.6 fL — ABNORMAL LOW (ref 80.0–100.0)
Platelets: 436 10*3/uL — ABNORMAL HIGH (ref 150–400)
RBC: 5.63 MIL/uL — ABNORMAL HIGH (ref 3.87–5.11)
RDW: 28.9 % — ABNORMAL HIGH (ref 11.5–15.5)
WBC: 6.9 10*3/uL (ref 4.0–10.5)
nRBC: 0 % (ref 0.0–0.2)

## 2021-07-20 LAB — COMPREHENSIVE METABOLIC PANEL
ALT: 78 U/L — ABNORMAL HIGH (ref 0–44)
AST: 71 U/L — ABNORMAL HIGH (ref 15–41)
Albumin: 4.3 g/dL (ref 3.5–5.0)
Alkaline Phosphatase: 55 U/L (ref 38–126)
Anion gap: 9 (ref 5–15)
BUN: 7 mg/dL (ref 6–20)
CO2: 23 mmol/L (ref 22–32)
Calcium: 9.2 mg/dL (ref 8.9–10.3)
Chloride: 102 mmol/L (ref 98–111)
Creatinine, Ser: 0.6 mg/dL (ref 0.44–1.00)
GFR, Estimated: >60 mL/min (ref >60)
Glucose, Bld: 131 mg/dL — ABNORMAL HIGH (ref 70–99)
Potassium: 3.9 mmol/L (ref 3.5–5.1)
Sodium: 134 mmol/L — ABNORMAL LOW (ref 135–145)
Total Bilirubin: 1.3 mg/dL — ABNORMAL HIGH (ref 0.3–1.2)
Total Protein: 9 g/dL — ABNORMAL HIGH (ref 6.5–8.1)

## 2021-07-20 LAB — HCV AB W REFLEX TO QUANT PCR: HCV Ab: <0.1 s/co ratio (ref 0.0–0.9)

## 2021-07-20 LAB — GLUCOSE, CAPILLARY
Glucose-Capillary: 116 mg/dL — ABNORMAL HIGH (ref 70–99)
Glucose-Capillary: 103 mg/dL — ABNORMAL HIGH (ref 70–99)

## 2021-07-20 LAB — HCV INTERPRETATION

## 2021-07-20 SURGERY — COLONOSCOPY WITH PROPOFOL
Anesthesia: Monitor Anesthesia Care

## 2021-07-20 MED ORDER — PROPOFOL 500 MG/50ML IV EMUL
INTRAVENOUS | Status: DC | PRN
  Administered 2021-07-20: 100 ug/kg/min via INTRAVENOUS

## 2021-07-20 MED ORDER — PROPOFOL 1000 MG/100ML IV EMUL
INTRAVENOUS | Status: AC
  Filled 2021-07-20: qty 100

## 2021-07-20 MED ORDER — PROPOFOL 10 MG/ML IV BOLUS
INTRAVENOUS | Status: DC | PRN
  Administered 2021-07-20: 40 mg via INTRAVENOUS
  Administered 2021-07-20: 50 mg via INTRAVENOUS
  Administered 2021-07-20: 30 mg via INTRAVENOUS

## 2021-07-20 MED ORDER — SODIUM CHLORIDE 0.9 % IV SOLN
250.0000 mg | Freq: Once | INTRAVENOUS | Status: AC
  Administered 2021-07-20: 250 mg via INTRAVENOUS
  Filled 2021-07-20: qty 20

## 2021-07-20 MED ORDER — POLYETHYLENE GLYCOL 3350 17 G PO PACK: 17.0000 g | PACK | Freq: Every day | ORAL | 0 refills | Status: AC

## 2021-07-20 MED ORDER — LACTATED RINGERS IV SOLN: INTRAVENOUS | Status: DC | PRN

## 2021-07-20 MED ORDER — IRON (FERROUS GLUCONATE) 256 (28 FE) MG PO TABS: 1.0000 | ORAL_TABLET | Freq: Every day | ORAL | Status: AC

## 2021-07-20 MED ORDER — HYDROCORTISONE ACETATE 25 MG RE SUPP: 25.0000 mg | Freq: Two times a day (BID) | RECTAL | 0 refills | Status: AC

## 2021-07-20 MED ORDER — HYDROCORTISONE ACETATE 25 MG RE SUPP
25.0000 mg | Freq: Two times a day (BID) | RECTAL | Status: DC
  Filled 2021-07-20: qty 1

## 2021-07-20 SURGICAL SUPPLY — 22 items

## 2021-07-20 NOTE — Anesthesia Procedure Notes (Addendum)
Procedure Name: MAC Date/Time: 07/20/2021 9:03 AM Performed by: Deliah Boston, CRNA Pre-anesthesia Checklist: Patient identified, Emergency Drugs available, Suction available and Patient being monitored Patient Re-evaluated:Patient Re-evaluated prior to induction Oxygen Delivery Method: Simple face mask Placement Confirmation: positive ETCO2 and breath sounds checked- equal and bilateral

## 2021-07-20 NOTE — Anesthesia Postprocedure Evaluation (Signed)
Anesthesia Post Note  Patient: Programmer, applications  Procedure(s) Performed: COLONOSCOPY WITH PROPOFOL BIOPSY     Patient location during evaluation: PACU Anesthesia Type: MAC Level of consciousness: awake Pain management: pain level controlled Vital Signs Assessment: post-procedure vital signs reviewed and stable Respiratory status: spontaneous breathing, nonlabored ventilation, respiratory function stable and patient connected to nasal cannula oxygen Cardiovascular status: stable and blood pressure returned to baseline Postop Assessment: no apparent nausea or vomiting Anesthetic complications: no   No notable events documented.  Last Vitals:  Vitals:   07/20/21 1000 07/20/21 1307  BP: 132/81 126/62  Pulse: 80 85  Resp: (!) 21 (!) 22  Temp:  36.7 C  SpO2: 100% 100%    Last Pain:  Vitals:   07/20/21 1000  TempSrc:   PainSc: 0-No pain                 Dawan Farney P Sherra Kimmons

## 2021-07-20 NOTE — Discharge Summary (Signed)
Physician Discharge Summary   Veronica Cannon  female DOB: [birth year]  ZOX:[id number]  PCP: Kerin Salen, PA-C  Admit date: 07/18/2021 Discharge date: 07/20/2021  Admitted From: home Disposition:  home CODE STATUS: Full code  Discharge Instructions     Ambulatory referral to Colorectal Surgery   Complete by: As directed    large internal hemorrhoids causing severe anemia   Discharge instructions   Complete by: As directed    Colonoscopy found large large internal hemorrhoids, which is likely the cause of your bleeding.  Please apply Anusol for 14 days.  And keep your stool soft.  If you continue to have bleeding problem, please colorectal surgery.  You are severely deficiency in iron.  Please take over-the-counter iron supplement.    Continue to follow up with your PCP to check your blood counts.   Dr. Darlin Priestly [facility] Course:  For full details, please see H&P, progress notes, consult notes and ancillary notes.  Briefly,  Veronica Cannon is a 35 y.o. female with medical history significant of DM2. Presenting with fatigue, DOE.   She reports that she's had daily bleeding with BM for over 2 years. She has seen a GI team years ago, but per her account, they were unable to find a cause. She continued as usual because she thought that was normal. She has noticed over the last few months she had become more fatigued. She continues to have bright red and dark blood w/ BMs. She has had increased dyspnea on exertion and intermittent lightheadedness w/ standing. She saw her PCP for regular follow up and lab work yesterday. It was noted that her Hgb was low. It was recommended that she come to the ED for assistance.  Hematochezia likely 2/2 internal hemorrhoids --colonoscopy found large internal hemorrhoids.  GI prescribed Anusol for 14 days, and outpatient colorectal surgery if bleeding continues.  Symptomatic microcytic anemia 2/2 GI bleed --Hgb  6.4 on presentation.  Pt received 1u pRBC.  Hgb improved to 8.1 prior to discharge.  Pt was found to be def in iron, iron 15 and 2% sat.  Pt received IV iron x2 doses and discharged on iron supplement. --pt was advised to follow up with PCP to monitor for her Hgb.  Abdominal pain --US abdomen RUQ found hepatic steatosis, but otherwise unremarkable.   Elevated LFTs --of unclear clinical significance. --monitor as outpatient   Hypokalemia, mild --repleted.   Discharge Diagnoses:  Active Problems:   Symptomatic anemia   GIB (gastrointestinal bleeding)   30 Day Unplanned Readmission Risk Score    Flowsheet Row ED to Hosp-Admission (Current) from 07/18/2021 in [facility]  30 Day Unplanned Readmission Risk Score (%) 6.69 Filed at 07/20/2021 0800       This score is the patient's risk of an unplanned readmission within 30 days of being discharged (0 -100%). The score is based on dignosis, age, lab data, medications, orders, and past utilization.   Low:  0-14.9   Medium: 15-21.9   High: 22-29.9   Extreme: 30 and above         Discharge Instructions:  Allergies as of 07/20/2021       Reactions   Cefuroxime Hives   Latex Rash        Medication List     TAKE these medications    hydrocortisone 25 MG suppository Commonly known as: ANUSOL-HC Place 1 suppository (25 mg total) rectally  2 (two) times daily for 14 days.   Iron (Ferrous Gluconate) 256 (28 Fe) MG Tabs Take 1 tablet by mouth daily. Can take any form of iron supplement over-the-counter.   metFORMIN 500 MG 24 hr tablet Commonly known as: GLUCOPHAGE-XR Take 500 mg by mouth daily.   polyethylene glycol 17 g packet Commonly known as: MiraLax Take 17 g by mouth daily.         Follow-up Information     Kerin Salen, PA-C Follow up in 1 week(s).   Specialty: Internal Medicine Contact information: [address] [place]  [address] [phone]                 Allergies  Allergen Reactions   Cefuroxime Hives   Latex Rash     The results of significant diagnostics from this hospitalization (including imaging, microbiology, ancillary and laboratory) are listed below for reference.   Consultations:   Procedures/Studies: US Abdomen Limited RUQ (LIVER/GB)  Result Date: 07/19/2021 CLINICAL DATA:  Elevated liver function tests EXAM: ULTRASOUND ABDOMEN LIMITED RIGHT UPPER QUADRANT COMPARISON:  09/14/2018 from [facility] FINDINGS: Gallbladder: No gallstones or wall thickening visualized. No sonographic Murphy sign noted by sonographer. Common bile duct: Diameter: Normal, 5 mm. Liver: Moderately increased hepatic echogenicity. Portal vein is patent on color Doppler imaging with normal direction of blood flow towards the liver. Other: None. IMPRESSION: Increased hepatic echogenicity, most consistent with steatosis. Electronically Signed   By: Jeronimo Greaves M.D.   On: 07/19/2021 13:46      Labs: BNP (last 3 results) No results for input(s): BNP in the last 8760 hours. Basic Metabolic Panel: Recent Labs  Lab 07/18/21 1903 07/19/21 1054 07/20/21 0040  NA 134* 136 134*  K 3.3* 3.7 3.9  CL 104 108 102  CO2 22 21* 23  GLUCOSE 101* 99 131*  BUN 14 9 7   CREATININE 0.62 0.46 0.60  CALCIUM 8.8* 9.1 9.2  MG  --  2.0  --    Liver Function Tests: Recent Labs  Lab 07/18/21 1903 07/19/21 1054 07/20/21 0040  AST 78* 76* 71*  ALT 81* 76* 78*  ALKPHOS 58 50 55  BILITOT 0.8 1.2 1.3*  PROT 8.7* 8.2* 9.0*  ALBUMIN 4.2 4.0 4.3   No results for input(s): LIPASE, AMYLASE in the last 168 hours. No results for input(s): AMMONIA in the last 168 hours. CBC: Recent Labs  Lab 07/18/21 1903 07/19/21 0640 07/19/21 1156 07/19/21 1824 07/20/21 0040  WBC 9.3 6.5  --   --  6.9  NEUTROABS 5.6 3.5  --   --   --   HGB 6.4* 6.6* 6.2* 7.4* 8.1*  HCT 24.7* 25.9* 24.4* 27.9* 31.3*  MCV 50.9* 51.2*  --   --   55.6*  PLT 434* 419*  --   --  436*   Cardiac Enzymes: No results for input(s): CKTOTAL, CKMB, CKMBINDEX, TROPONINI in the last 168 hours. BNP: Invalid input(s): POCBNP CBG: Recent Labs  Lab 07/19/21 0838 07/20/21 0849 07/20/21 0945  GLUCAP 110* 116* 103*   D-Dimer No results for input(s): DDIMER in the last 72 hours. Hgb A1c No results for input(s): HGBA1C in the last 72 hours. Lipid Profile No results for input(s): CHOL, HDL, LDLCALC, TRIG, CHOLHDL, LDLDIRECT in the last 72 hours. Thyroid function studies No results for input(s): TSH, T4TOTAL, T3FREE, THYROIDAB in the last 72 hours.  Invalid input(s): FREET3 Anemia work up Recent Labs    07/18/21 2251  VITAMINB12 361  FOLATE  60.9  FERRITIN 6*  TIBC 679*  IRON 15*  RETICCTPCT 1.7   Urinalysis    Component Value Date/Time   COLORURINE YELLOW 07/18/2021 1923   APPEARANCEUR CLEAR 07/18/2021 1923   LABSPEC 1.010 07/18/2021 1923   PHURINE 6.0 07/18/2021 1923   GLUCOSEU NEGATIVE 07/18/2021 1923   HGBUR NEGATIVE 07/18/2021 1923   BILIRUBINUR NEGATIVE 07/18/2021 1923   KETONESUR 80 (A) 07/18/2021 1923   PROTEINUR NEGATIVE 07/18/2021 1923   UROBILINOGEN 0.2 01/29/2014 1655   NITRITE NEGATIVE 07/18/2021 1923   LEUKOCYTESUR NEGATIVE 07/18/2021 1923   Sepsis Labs Invalid input(s): PROCALCITONIN,  WBC,  LACTICIDVEN Microbiology Recent Results (from the past 240 hour(s))  SARS CORONAVIRUS 2 (TAT 6-24 HRS) Nasopharyngeal Nasopharyngeal Swab     Status: None   Collection Time: 07/18/21 10:51 PM   Specimen: Nasopharyngeal Swab  Result Value Ref Range Status   SARS Coronavirus 2 NEGATIVE NEGATIVE Final    Comment: (NOTE) SARS-CoV-2 target nucleic acids are NOT DETECTED.  The SARS-CoV-2 RNA is generally detectable in upper and lower respiratory specimens during the acute phase of infection. Negative results do not preclude SARS-CoV-2 infection, do not rule out co-infections with other pathogens, and should not be  used as the sole basis for treatment or other patient management decisions. Negative results must be combined with clinical observations, patient history, and epidemiological information. The expected result is Negative.  Fact Sheet for Patients: HairSlick.no  Fact Sheet for Healthcare Providers: quierodirigir.com  This test is not yet approved or cleared by the [place] FDA and  has been authorized for detection and/or diagnosis of SARS-CoV-2 by FDA under an Emergency Use Authorization (EUA). This EUA will remain  in effect (meaning this test can be used) for the duration of the COVID-19 declaration under Se ction 564(b)(1) of the Act, 21 U.S.C. section 360bbb-3(b)(1), unless the authorization is terminated or revoked sooner.  Performed at [facility], [address], [place], [place] [address]      Total time spend on discharging this patient, including the last patient exam, discussing the hospital stay, instructions for ongoing care as it relates to all pertinent caregivers, as well as preparing the medical discharge records, prescriptions, and/or referrals as applicable, is 35 minutes.    Darlin Priestly, MD  [facility] 07/20/2021, 10:22 AM

## 2021-07-20 NOTE — Op Note (Signed)
[facility] Patient Name: Veronica Cannon Procedure Date: 07/20/2021 MRN: [id number] Attending MD: Kathi Der , MD Date of Birth: Apr 07, 1986 CSN: [id number] Age: 35 Admit Type: Inpatient Procedure:                Colonoscopy Indications:              Last colonoscopy: 2019, Rectal bleeding Providers:                Kathi Der, MD, Margaree Mackintosh, RN,                            Beryle Beams, Technician, Randon Goldsmith, CRNA Referring MD:              Medicines:                Sedation Administered by an Anesthesia Professional Complications:            No immediate complications. Estimated Blood Loss:     Estimated blood loss was minimal. Estimated blood                            loss was minimal. Procedure:                Pre-Anesthesia Assessment:                           - Prior to the procedure, a History and Physical                            was performed, and patient medications and                            allergies were reviewed. The patient's tolerance of                            previous anesthesia was also reviewed. The risks                            and benefits of the procedure and the sedation                            options and risks were discussed with the patient.                            All questions were answered, and informed consent                            was obtained. Prior Anticoagulants: The patient has                            taken no previous anticoagulant or antiplatelet                            agents. ASA Grade Assessment: II - A patient with  mild systemic disease. After reviewing the risks                            and benefits, the patient was deemed in                            satisfactory condition to undergo the procedure.                           After obtaining informed consent, the colonoscope                            was passed under direct vision.  Throughout the                            procedure, the patient's blood pressure, pulse, and                            oxygen saturations were monitored continuously. The                            [id number]-HQ190L ([id number]) Olympus colonoscope was                            introduced through the anus and advanced to the the                            terminal ileum, with identification of the                            appendiceal orifice and IC valve. The colonoscopy                            was performed without difficulty. The patient                            tolerated the procedure well. The quality of the                            bowel preparation was good. Scope In: 9:08:56 AM Scope Out: 9:28:55 AM Total Procedure Duration: 0 hours 19 minutes 59 seconds  Findings:      Skin tags were found on perianal exam.      The terminal ileum appeared normal.      A diminutive polyp was found in the transverse colon. The polyp was       removed with a cold biopsy forceps. Resection and retrieval were       complete.      Internal hemorrhoids were found during retroflexion. The hemorrhoids       were large.      A scar was found in the rectum. Impression:               - Perianal skin tags found on perianal exam.                           -  The examined portion of the ileum was normal.                           - One diminutive polyp in the transverse colon,                            removed with a cold biopsy forceps. Resected and                            retrieved.                           - Internal hemorrhoids.                           - Scar in the rectum. Moderate Sedation:      Moderate (conscious) sedation was personally administered by an       anesthesia professional. The following parameters were monitored: oxygen       saturation, heart rate, blood pressure, and response to care. Recommendation:           - Return patient to hospital ward for ongoing care.                            - Soft diet.                           - Continue present medications.                           - Await pathology results.                           -Bleeding most likely from hemorrhoids. Start                            Anusol suppository. She may benefit from outpatient                            surgery consult for hemorrhoid surgery or band                            ligation. Procedure Code(s):        --- Professional ---                           [phone], Colonoscopy, flexible; with biopsy, single                            or multiple Diagnosis Code(s):        --- Professional ---                           K64.8, Other hemorrhoids                           K63.5, Polyp of colon  K64.4, Residual hemorrhoidal skin tags                           K62.5, Hemorrhage of anus and rectum CPT copyright 2019 American Medical Association. All rights reserved. The codes documented in this report are preliminary and upon coder review may  be revised to meet current compliance requirements. Kathi Der, MD Kathi Der, MD 07/20/2021 9:43:00 AM Number of Addenda: 0

## 2021-07-20 NOTE — Brief Op Note (Signed)
07/18/2021 - 07/20/2021  9:43 AM  PATIENT:  Veronica Cannon  35 y.o. female  PRE-OPERATIVE DIAGNOSIS:  rectal bleeding, anemia  POST-OPERATIVE DIAGNOSIS:  transverse colon polyp- removed \ hemorrhoids   PROCEDURE:  Procedure(s): COLONOSCOPY WITH PROPOFOL (N/A) BIOPSY  SURGEON:  Surgeon(s) and Role:    * Jamez Ambrocio, MD - Primary  Findings ---------- -Colonoscopy showed normal terminal ileum, no evidence of active bleeding inside the colon.  She was found to have large hemorrhoids which might be the source for her rectal bleeding. -Diminutive polyp in the transverse colon, removed with forcep.  Recommendations ------------------------- -Start Anusol suppository -Recommend outpatient surgery evaluation for large internal hemorrhoids -No further inpatient work-up planned.  GI will sign off.  Call us back if needed  Kathi Der MD, FACP 07/20/2021, 9:44 AM  Contact #  [phone]

## 2021-07-20 NOTE — Transfer of Care (Signed)
Immediate Anesthesia Transfer of Care Note  Patient: Veronica Cannon  Procedure(s) Performed: Procedure(s): COLONOSCOPY WITH PROPOFOL (N/A) BIOPSY  Patient Location: PACU  Anesthesia Type:MAC  Level of Consciousness: Patient easily awoken, comfortable, cooperative, following commands, responds to stimulation.   Airway & Oxygen Therapy: Patient spontaneously breathing, ventilating well, oxygen via simple oxygen mask.  Post-op Assessment: Report given to PACU RN, vital signs reviewed and stable, moving all extremities.   Post vital signs: Reviewed and stable.  Complications: No apparent anesthesia complications  Last Vitals:  Vitals Value Taken Time  BP 111/76 07/20/21 0938  Temp    Pulse 77 07/20/21 0940  Resp 27 07/20/21 0940  SpO2 100 % 07/20/21 0940  Vitals shown include unvalidated device data.  Last Pain:  Vitals:   07/20/21 0843  TempSrc: Oral  PainSc: 0-No pain         Complications: No notable events documented.

## 2021-07-20 NOTE — Anesthesia Preprocedure Evaluation (Addendum)
Anesthesia Evaluation  Patient identified by MRN, date of birth, ID band Patient awake    Reviewed: Allergy & Precautions, NPO status , Patient's Chart, lab work & pertinent test results  Airway Mallampati: III  TM Distance: >3 FB Neck ROM: Full    Dental no notable dental hx.    Pulmonary neg pulmonary ROS,    Pulmonary exam normal breath sounds clear to auscultation       Cardiovascular Normal cardiovascular exam Rhythm:Regular Rate:Normal     Neuro/Psych negative neurological ROS  negative psych ROS   GI/Hepatic Neg liver ROS,   Endo/Other  diabetes, Oral Hypoglycemic Agents  Renal/GU negative Renal ROS     Musculoskeletal negative musculoskeletal ROS (+)   Abdominal   Peds  Hematology  (+) anemia ,   Anesthesia Other Findings rectal bleeding anemia  Reproductive/Obstetrics hcg negative                            Anesthesia Physical Anesthesia Plan  ASA: 2  Anesthesia Plan: MAC   Post-op Pain Management:    Induction: Intravenous  PONV Risk Score and Plan: 2 and Propofol infusion and Treatment may vary due to age or medical condition  Airway Management Planned: Simple Face Mask  Additional Equipment:   Intra-op Plan:   Post-operative Plan:   Informed Consent: I have reviewed the patients History and Physical, chart, labs and discussed the procedure including the risks, benefits and alternatives for the proposed anesthesia with the patient or authorized representative who has indicated his/her understanding and acceptance.     Dental advisory given  Plan Discussed with: CRNA  Anesthesia Plan Comments:        Anesthesia Quick Evaluation

## 2021-07-20 NOTE — Interval H&P Note (Signed)
History and Physical Interval Note:  07/20/2021 8:55 AM  Veronica Cannon  has presented today for surgery, with the diagnosis of rectal bleeding, anemia.  The various methods of treatment have been discussed with the patient and family. After consideration of risks, benefits and other options for treatment, the patient has consented to  Procedure(s): COLONOSCOPY WITH PROPOFOL (N/A) as a surgical intervention.  The patient's history has been reviewed, patient examined, no change in status, stable for surgery.  I have reviewed the patient's chart and labs.  Questions were answered to the patient's satisfaction.     Chantz Montefusco

## 2021-07-22 ENCOUNTER — Encounter (HOSPITAL_COMMUNITY): Payer: Self-pay | Admitting: Gastroenterology

## 2021-07-22 LAB — TYPE AND SCREEN
ABO/RH(D): O POS
Antibody Screen: NEGATIVE
Unit division: 0

## 2021-07-22 LAB — BPAM RBC
Blood Product Expiration Date: 202210202359
ISSUE DATE / TIME: 202209161420
Unit Type and Rh: 5100

## 2021-07-25 LAB — SURGICAL PATHOLOGY

## 2021-07-28 IMAGING — CT CT ANGIO HEAD-NECK
1 of 8 series · 14 of 47 positions shown · IV contrast (OMNI)
Comparison: Noncontrast head CT 11/07/2020.

CLINICAL DATA: Stroke/TIA, assess extracranial arteries.
Stroke/TIA, assess intracranial arteries. Left-sided weakness.

EXAM:
CT ANGIOGRAPHY HEAD AND NECK
TECHNIQUE: Multidetector CT imaging of the head and neck was performed using
the standard protocol during bolus administration of intravenous
contrast. Multiplanar CT image reconstructions and MIPs were
obtained to evaluate the vascular anatomy. Carotid stenosis
measurements (when applicable) are obtained utilizing NASCET
criteria, using the distal internal carotid diameter as the
denominator.
CONTRAST:  75mL OMNIPAQUE IOHEXOL 350 MG/ML SOLN

[Series 6: thin · axial · 0.52mm/px · z∈[-299,-9]mm · 14 of 669 slices shown]
[im 45/669  brain]
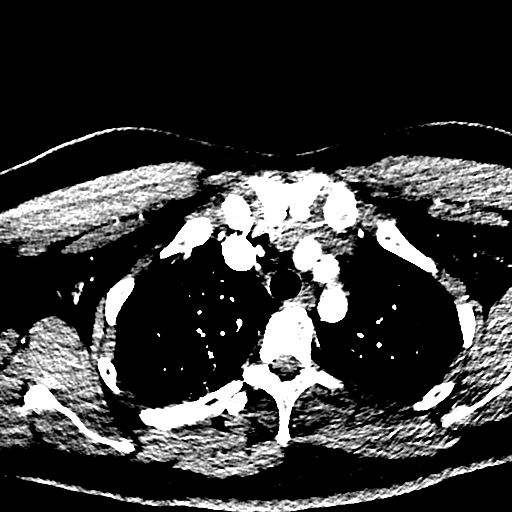
[im 90/669  bone]
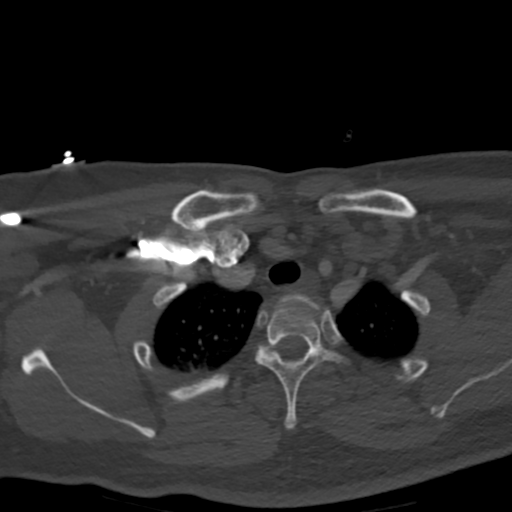
[im 134/669  brain]
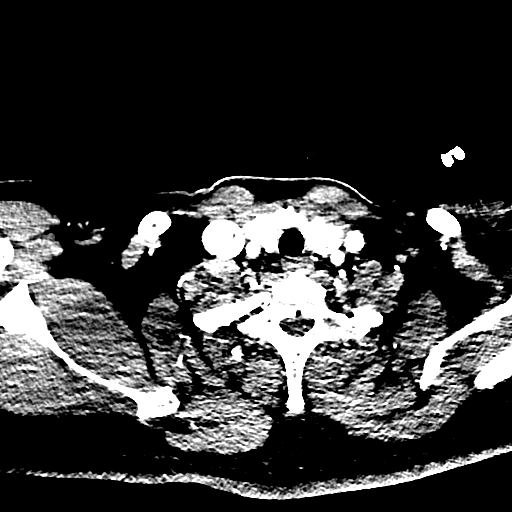
[im 179/669  bone]
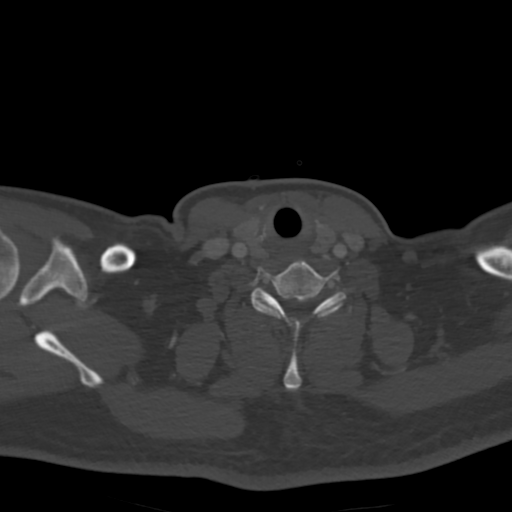
[im 223/669  brain]
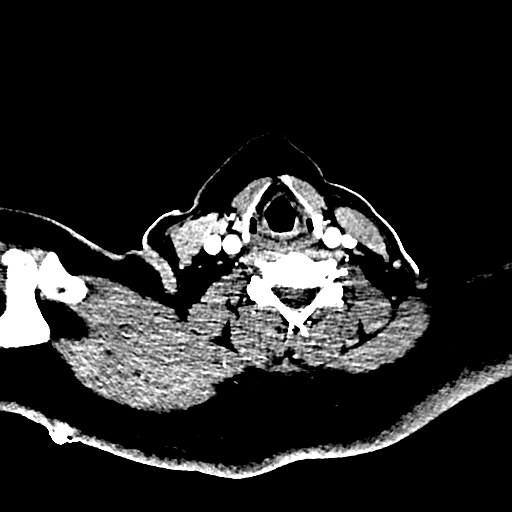
[im 268/669  bone]
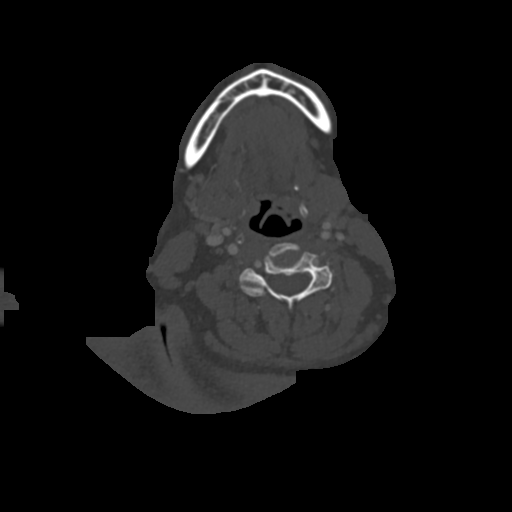
[im 312/669  brain]
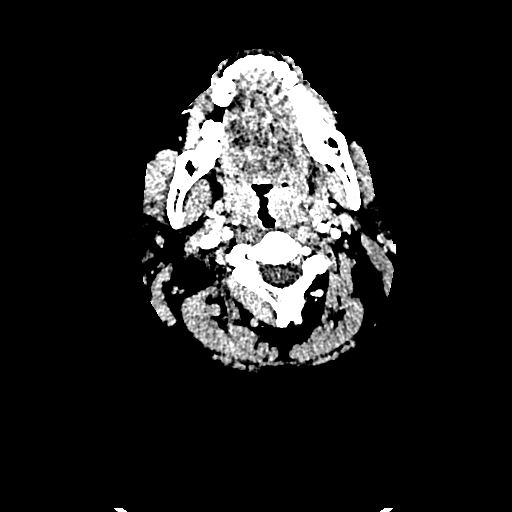
[im 357/669  bone]
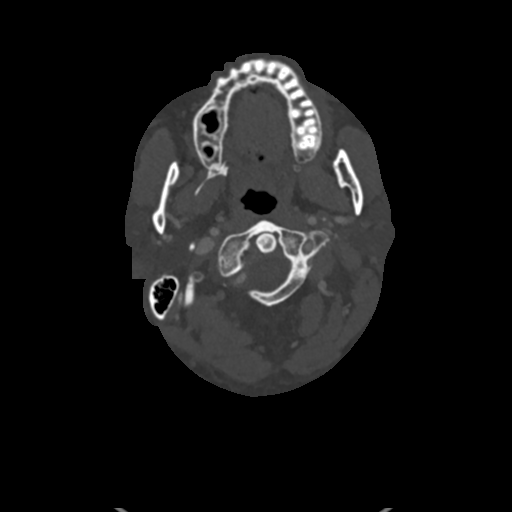
[im 401/669  brain]
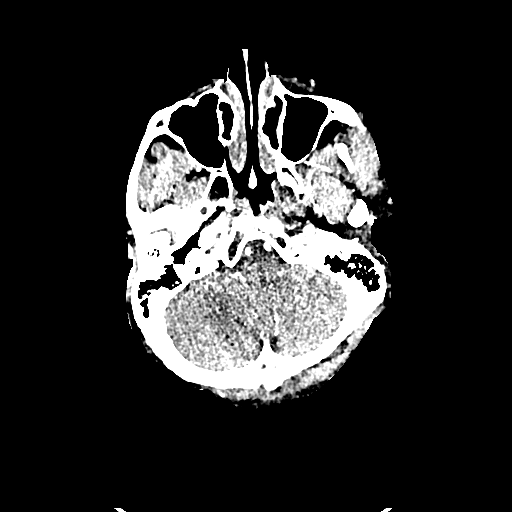
[im 446/669  bone]
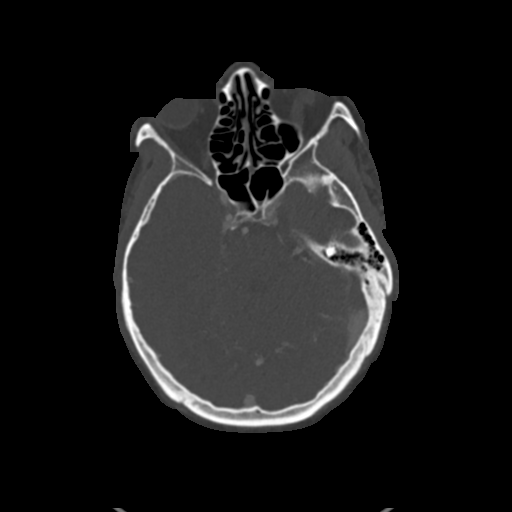
[im 490/669  brain]
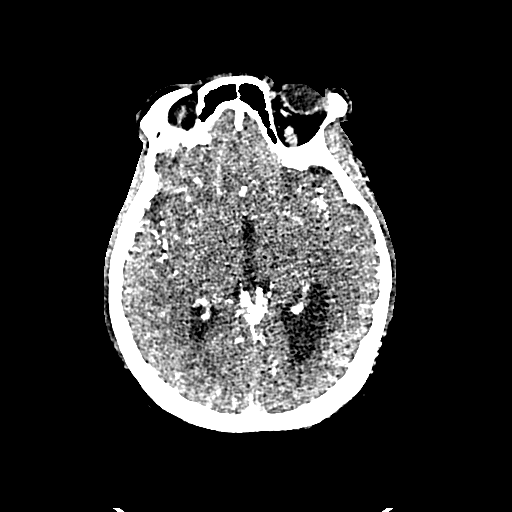
[im 535/669  bone]
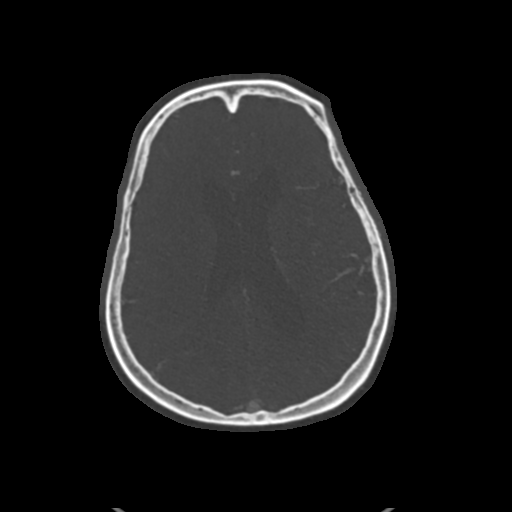
[im 579/669  brain]
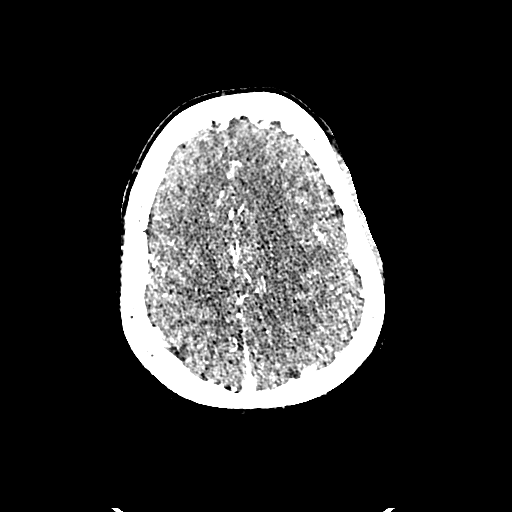
[im 624/669  bone]
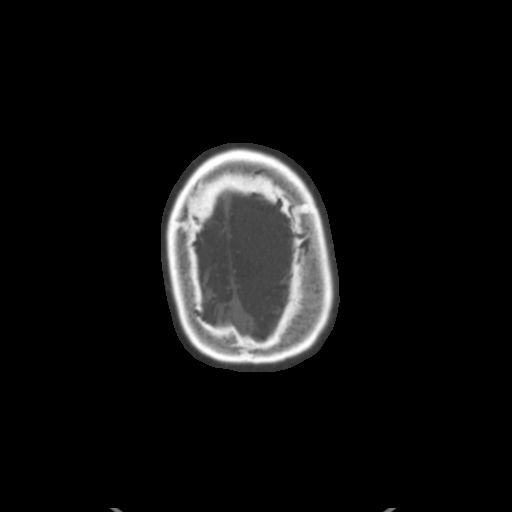

[14 of 47 positions shown; findings below may reference images not displayed]

FINDINGS: CTA NECK FINDINGS

Aortic arch: Standard aortic branching. The visualized aortic arch
is unremarkable. No hemodynamically significant innominate or
proximal subclavian artery stenosis.

Right carotid system: CCA and ICA patent within the neck without
stenosis. No significant atherosclerotic disease.

Left carotid system: CCA and ICA patent within the neck without
stenosis. No significant atherosclerotic disease.

Vertebral arteries: Codominant and patent within the neck without
stenosis.

Skeleton: No acute bony abnormality or aggressive osseous lesion.
Cervical levocurvature with partially imaged thoracic
dextrocurvature.

Other neck: Subcentimeter thyroid nodules not meeting consensus
criteria for ultrasound follow-up. Nonspecific mildly enlarged left
level 2 lymph node measuring 12 mm in short axis (series 7, image
160).

Upper chest: Patchy and linear opacities within the imaged right
lung apex may reflect atelectasis or pneumonia.

Review of the MIP images confirms the above findings

CTA HEAD FINDINGS

Anterior circulation:

The intracranial internal carotid arteries are patent. The M1 middle
cerebral arteries are patent. No M2 proximal branch occlusion or
high-grade proximal stenosis is identified. The anterior cerebral
arteries are patent. No intracranial aneurysm is identified.

Posterior circulation:

The intracranial vertebral arteries are patent. The basilar artery
is patent. The posterior cerebral arteries are patent. Posterior
communicating arteries are present bilaterally.

Venous sinuses: Within the limitations of contrast timing, no
convincing thrombus.

Anatomic variants: None significant

Other: 7 mm partially calcified dural-based focus overlying the mid
left frontal lobe likely reflecting an incidental tiny meningioma.

Review of the MIP images confirms the above findings

These results were communicated to Dr. Boxhufi At [DATE] pmon
11/07/2020by text page via the [employer] messaging system.
IMPRESSION: CTA neck:

1. The common carotid, internal carotid and vertebral arteries are
patent within the neck without hemodynamically significant stenosis.
2. Patchy and linear opacities within the imaged right lung apex,
which may reflect atelectasis or pneumonia.
3. Nonspecific mildly enlarged left level 2 lymph node. Clinical
correlation recommended.

CTA head:

1. No intracranial large vessel occlusion or proximal high-grade
arterial stenosis.
2. 7 mm probable incidental meningioma overlying the mid left
frontal lobe.

## 2022-04-08 IMAGING — US US ABDOMEN LIMITED
1 series · 15 of 25 positions shown · non-contrast
Comparison: 09/14/2018 from [REDACTED]

CLINICAL DATA: Elevated liver function tests

EXAM:
ULTRASOUND ABDOMEN LIMITED RIGHT UPPER QUADRANT

[Series 1: us abdomen limited ruq mc & wl · 15 of 45 slices shown]
[im 1/45]
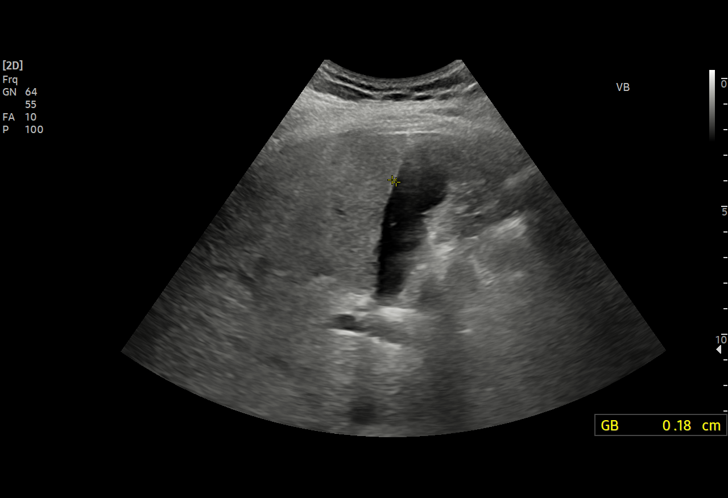
[im 4/45]
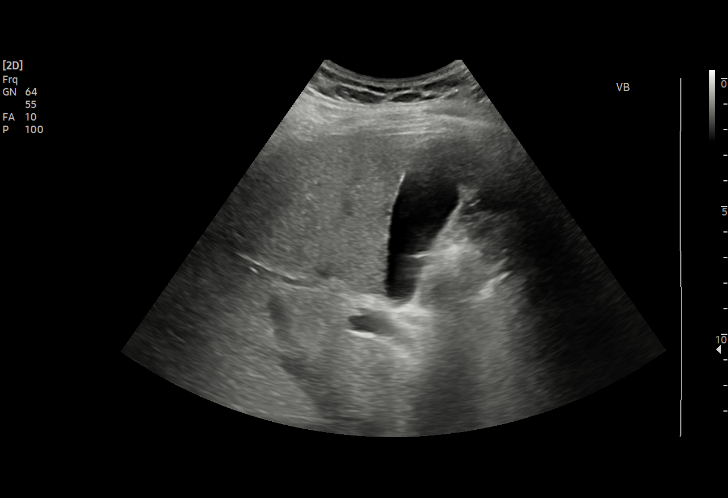
[im 8/45]
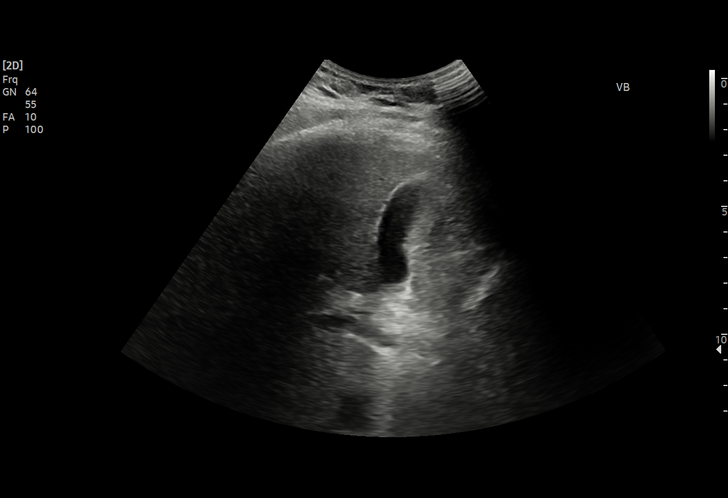
[im 10/45]
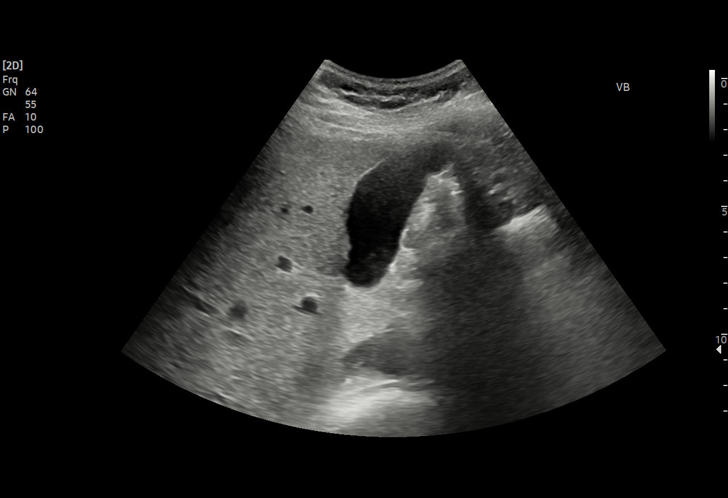
[im 13/45]
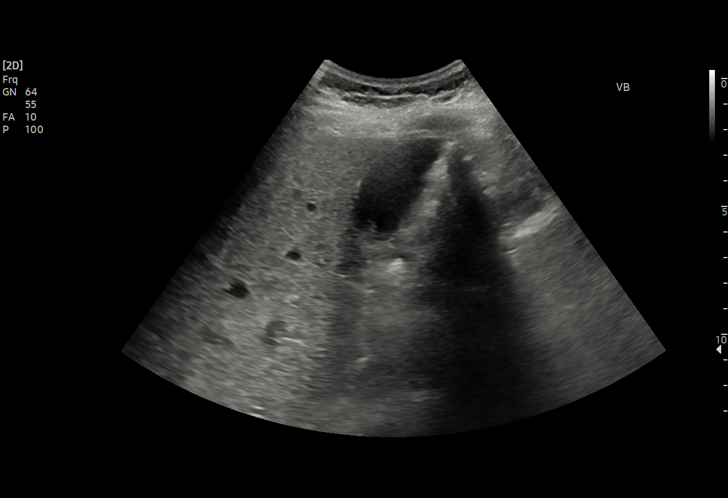
[im 17/45]
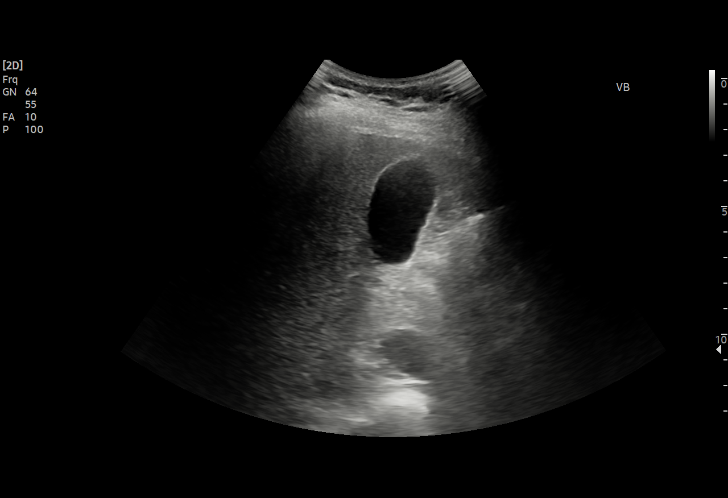
[im 19/45]
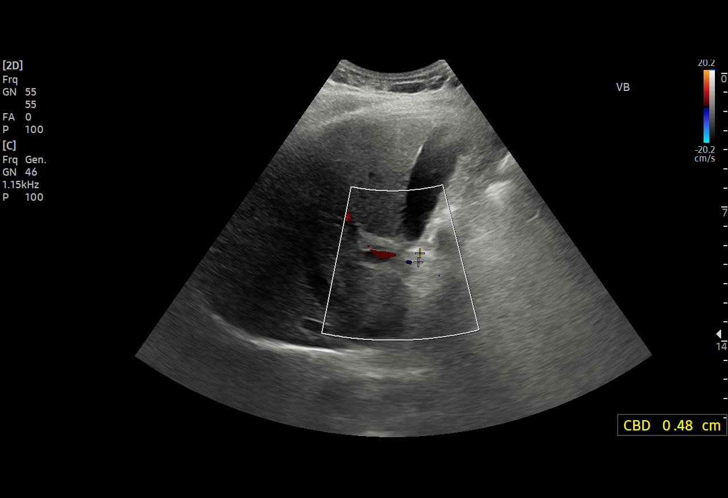
[im 23/45]
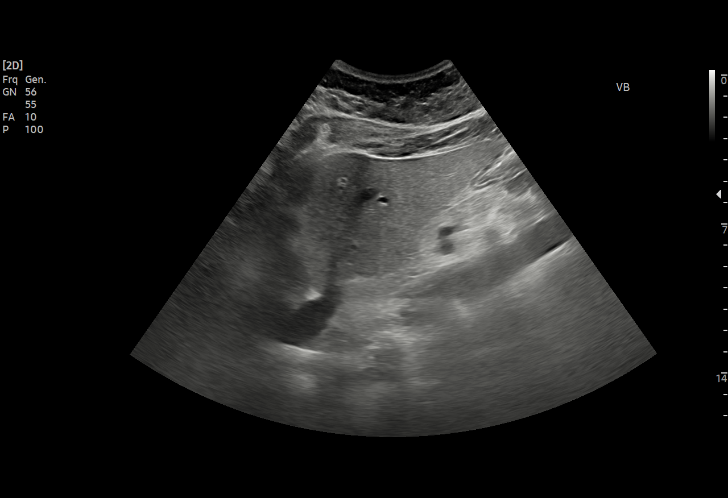
[im 26/45]
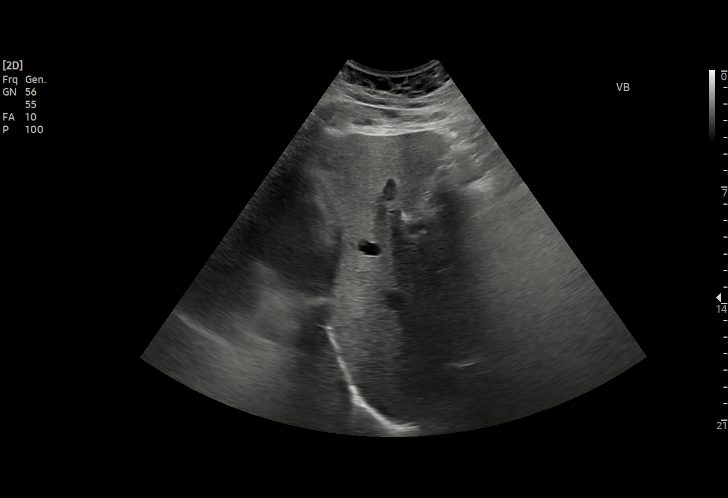
[im 28/45]
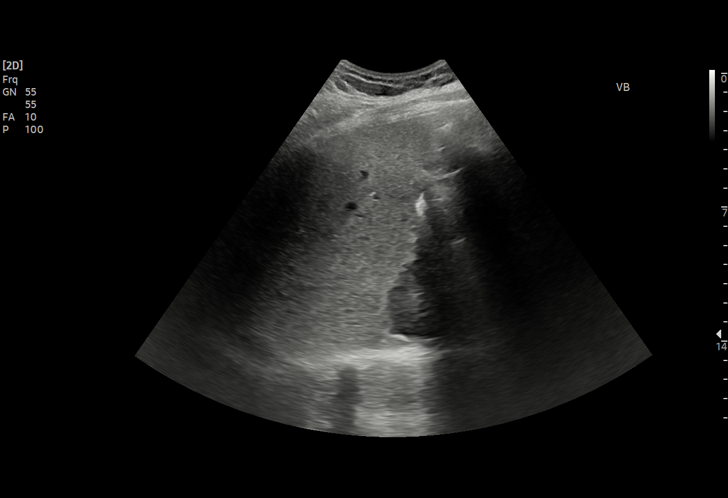
[im 32/45]
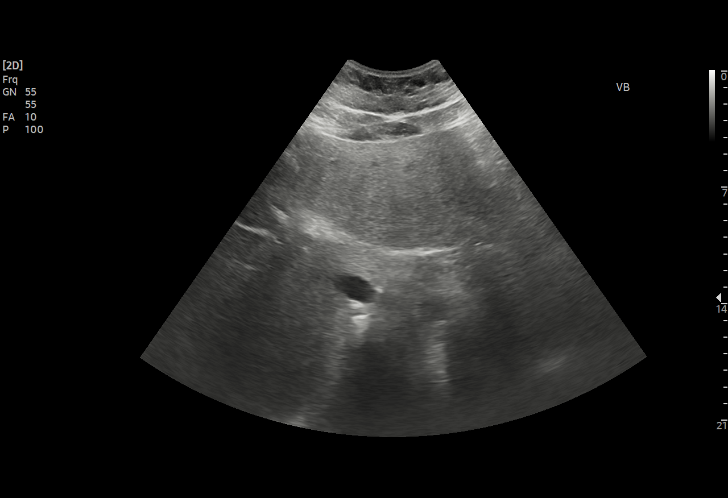
[im 35/45]
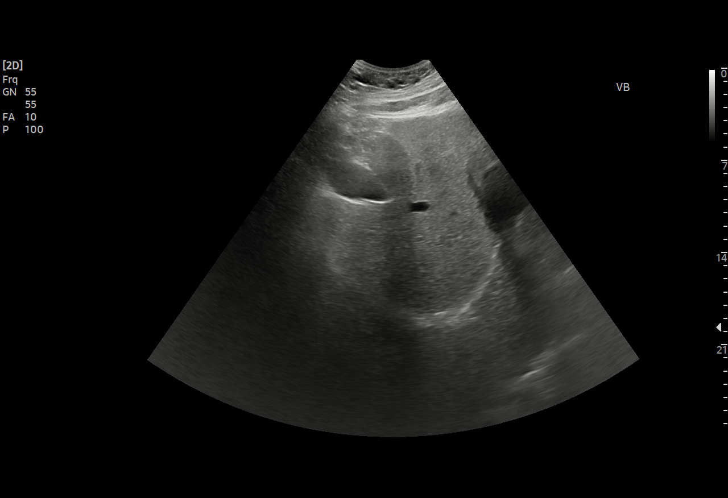
[im 37/45]
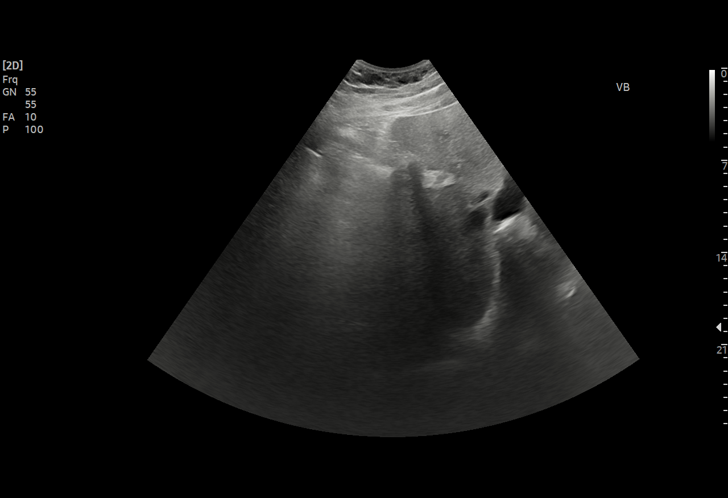
[im 41/45]
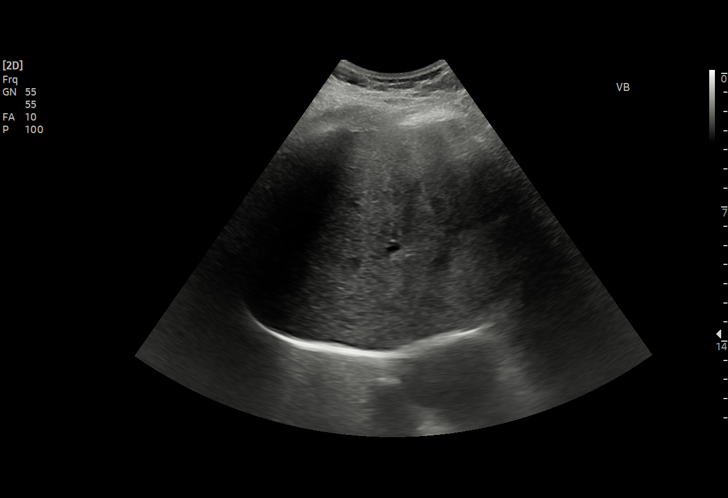
[im 45/45]
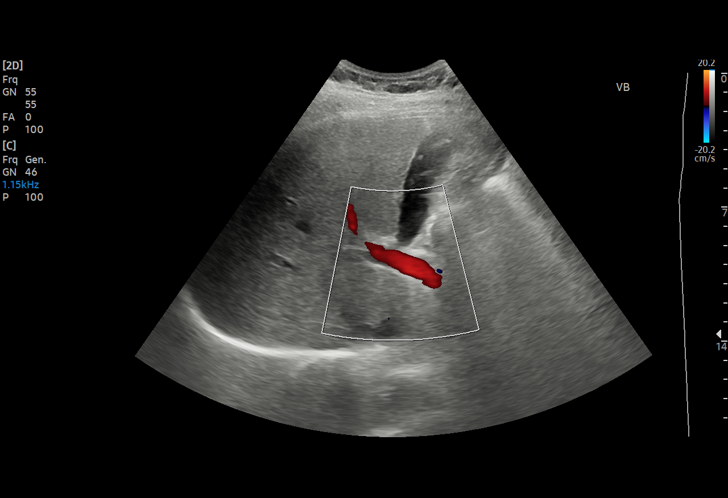

[15 of 25 positions shown; findings below may reference images not displayed]

FINDINGS: Gallbladder:

No gallstones or wall thickening visualized. No sonographic Murphy
sign noted by sonographer.

Common bile duct:

Diameter: Normal, 5 mm.

Liver:

Moderately increased hepatic echogenicity. Portal vein is patent on
color Doppler imaging with normal direction of blood flow towards
the liver.

Other: None.
IMPRESSION: Increased hepatic echogenicity, most consistent with steatosis.
